# Patient Record
Sex: Male | Born: 1995 | Race: White | Hispanic: No | Marital: Single | Smoking: Current every day smoker
Health system: Southern US, Community
[De-identification: ages and names within clinical notes are randomized; demographics above are authoritative.]

## PROBLEM LIST (undated history)

## (undated) HISTORY — PX: WISDOM TOOTH EXTRACTION: SHX21

---
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
## 2015-12-15 ENCOUNTER — Telehealth: Payer: Self-pay | Admitting: Internal Medicine

## 2015-12-15 NOTE — Telephone Encounter (Signed)
Pt has not been seen in 3 years. Would need 30 minute new patient appt.

## 2015-12-15 NOTE — Telephone Encounter (Signed)
Mom of patient called stating that patient is having dizziness and heart palpitations. Transferred to [facility]. Spoke with [place].

## 2015-12-15 NOTE — Telephone Encounter (Signed)
[facility]  DOB: 06/27/1995    Initial Comment Pt is ahving heart palpitations and dizziness   Nurse Assessment  Nurse: Scarlette ArStandifer, RN, Heather Date/Time (Eastern Time): 12/15/2015 12:21:23 PM  Confirm and document reason for call. If symptomatic, describe symptoms. You must click the next button to save text entered. ---Caller states that her son started with heart racing and feeling bad on Saturday night, he felt bad yesterday but no heart racing. Today his heart is racing again  Has the patient traveled out of the country within the last 30 days? ---Not Applicable  Does the patient have any new or worsening symptoms? ---Yes  Will a triage be completed? ---Yes  Related visit to physician within the last 2 weeks? ---No  Does the PT have any chronic conditions? (i.e. diabetes, asthma, etc.) ---No  Is this a behavioral health or substance abuse call? ---No     Guidelines    Guideline Title Affirmed Question Affirmed Notes  Heart Rate and Heartbeat Questions [1] Palpitations AND [2] no improvement after following Care Advice    Final Disposition User   See PCP When Office is Open (within 3 days) Standifer, RN, Herbert SetaHeather    Comments  Appt made tomorrow at 2:45 pm with Dr. Drue NovelPaz   Referrals  REFERRED TO PCP OFFICE   Disagree/Comply: Comply

## 2015-12-15 NOTE — Telephone Encounter (Signed)
Pt has an appt scheduled tomorrow at 2:45 pm with Dr. Drue NovelPaz.

## 2015-12-16 ENCOUNTER — Ambulatory Visit (INDEPENDENT_AMBULATORY_CARE_PROVIDER_SITE_OTHER): Payer: Medicaid Other | Admitting: Internal Medicine

## 2015-12-16 ENCOUNTER — Encounter: Payer: Self-pay | Admitting: Internal Medicine

## 2015-12-16 VITALS — BP 134/76 | HR 93 | Temp 98.1°F | Resp 14 | Ht 73.0 in | Wt 240.2 lb

## 2015-12-16 DIAGNOSIS — R002 Palpitations: Secondary | ICD-10-CM

## 2015-12-16 NOTE — Progress Notes (Signed)
Pre visit review using our clinic review tool, if applicable. No additional management support is needed unless otherwise documented below in the visit note. 

## 2015-12-16 NOTE — Progress Notes (Signed)
   Subjective:    Patient ID: Edward Garrett, male    DOB: 12/24/1995, 20 y.o.   MRN: [id number]  DOS:  12/16/2015 Type of visit - description : acute, here w/ his mother  Interval history: Chief complaint is palpitations. 2 days ago, he [place] and suddenly felt his heart was going really fast, it lasted a few minutes and happened again shortly after. At the time he felt slightly dizzy and the mother states that he looked pale. There was no chest pain or difficulty breathing. No diaphoresis or presyncope spell. Some nausea?. That particular day, he was not exhausted although he only had 3 hours of sleep. Denies EtOH,  or lack of fluids/food.  Now feels back to baseline.  Review of Systems  When he was in school, he was active in sports, never had a syncope episode, no chest pain or difficulty breathing.  History reviewed. No pertinent past medical history.  Past Surgical History:  Procedure Laterality Date  . WISDOM TOOTH EXTRACTION      Social History   Social History  . Marital status: Single    Spouse name: N/A  . Number of children: 0  . Years of education: N/A   Occupational History  . works     Social History Main Topics  . Smoking status: Current Every Day Smoker    Types: E-cigarettes  . Smokeless tobacco: Current User    Types: Chew  . Alcohol use No  . Drug use: No  . Sexual activity: Not on file   Other Topics Concern  . Not on file   Social History Narrative   Household: F, M           Family History  Problem Relation Age of Onset  . Coronary artery disease      multiple fam members  . CAD Father 9140  . Sudden Cardiac Death Other     ??  . Diabetes Neg Hx        Medication List    as of 12/16/2015 11:59 PM   You have not been prescribed any medications.        Objective:   Physical Exam BP 134/76 (BP Location: Left Arm, Patient Position: Sitting, Cuff Size: Normal)   Pulse 93   Temp 98.1 F (36.7 C)  (Oral)   Resp 14   Ht 6\' 1"  (1.854 m)   Wt 240 lb 4 oz (109 kg)   SpO2 97%   BMI 31.70 kg/m   General:   Well developed, well nourished . NAD.  Neck: No  thyromegaly  HEENT:  Normocephalic . Face symmetric, atraumatic Lungs:  CTA B Normal respiratory effort, no intercostal retractions, no accessory muscle use. Heart: RRR,  no murmur.  No pretibial edema bilaterally  Abdomen:  Not distended, soft, non-tender. No rebound or rigidity.   Skin: Exposed areas without rash. Not pale. Not jaundice Neurologic:  alert & oriented X3.  Speech normal, gait appropriate for age and unassisted Strength symmetric and appropriate for age.  Psych: Cognition and judgment appear intact.  Cooperative with normal attention span and concentration.  Behavior appropriate. No anxious or depressed appearing.    Assessment & Plan:   Palpitations: 2 episodes of palpitations without clear triggers associated with mild nausea and pallor.  EKG today: Sinus rhythm. Plan: BMP, TSH, CBC. Cards referral. Further eval?

## 2015-12-16 NOTE — Patient Instructions (Signed)
GO TO THE LAB : Get the blood work     GO TO THE FRONT DESK Schedule your next appointment for a yearly checkup in few months  If you have another episode, please let us know

## 2015-12-17 ENCOUNTER — Encounter: Payer: Self-pay | Admitting: Internal Medicine

## 2015-12-17 LAB — CBC WITH DIFFERENTIAL/PLATELET
BASOS PCT: 0.4 % (ref 0.0–3.0)
Basophils Absolute: 0 10*3/uL (ref 0.0–0.1)
EOS PCT: 1.4 % (ref 0.0–5.0)
Eosinophils Absolute: 0.1 10*3/uL (ref 0.0–0.7)
HEMATOCRIT: 49.6 % (ref 39.0–52.0)
HEMOGLOBIN: 17.1 g/dL — AB (ref 13.0–17.0)
LYMPHS PCT: 17.9 % (ref 12.0–46.0)
Lymphs Abs: 1.7 10*3/uL (ref 0.7–4.0)
MCHC: 34.4 g/dL (ref 30.0–36.0)
MCV: 86.5 fl (ref 78.0–100.0)
Monocytes Absolute: 0.6 10*3/uL (ref 0.1–1.0)
Monocytes Relative: 6.6 % (ref 3.0–12.0)
Neutro Abs: 6.8 10*3/uL (ref 1.4–7.7)
Neutrophils Relative %: 73.7 % (ref 43.0–77.0)
Platelets: 261 10*3/uL (ref 150.0–400.0)
RBC: 5.74 Mil/uL (ref 4.22–5.81)
RDW: 12.5 % (ref 11.5–14.6)
WBC: 9.2 10*3/uL (ref 4.5–10.5)

## 2015-12-17 LAB — BASIC METABOLIC PANEL
BUN: 15 mg/dL (ref 6–23)
CO2: 30 mEq/L (ref 19–32)
Calcium: 9.9 mg/dL (ref 8.4–10.5)
Chloride: 101 mEq/L (ref 96–112)
Creatinine, Ser: 1.09 mg/dL (ref 0.40–1.50)
GFR: 91.64 mL/min (ref >60.00)
Glucose, Bld: 86 mg/dL (ref 70–99)
POTASSIUM: 4.4 meq/L (ref 3.5–5.1)
SODIUM: 139 meq/L (ref 135–145)

## 2015-12-17 LAB — TSH: TSH: 2.37 u[IU]/mL (ref 0.35–5.50)

## 2015-12-19 ENCOUNTER — Encounter: Payer: Self-pay | Admitting: Cardiology

## 2015-12-19 ENCOUNTER — Telehealth: Payer: Self-pay | Admitting: Internal Medicine

## 2015-12-19 NOTE — Telephone Encounter (Signed)
Tried calling Pt's mother, Burna MortimerWanda, at mobile number- no answer, unable to leave message. Also, called home number, no answer, unable to leave message. Will try again later.

## 2015-12-19 NOTE — Telephone Encounter (Signed)
Caller name:Gotay,Wanda Relation to pt: mother  Call back number:[phone]   Reason for call:  Mother inquiring about lab results, please advise

## 2015-12-22 ENCOUNTER — Encounter (INDEPENDENT_AMBULATORY_CARE_PROVIDER_SITE_OTHER): Payer: Self-pay

## 2015-12-22 ENCOUNTER — Ambulatory Visit (INDEPENDENT_AMBULATORY_CARE_PROVIDER_SITE_OTHER): Payer: Self-pay | Admitting: Cardiology

## 2015-12-22 ENCOUNTER — Encounter: Payer: Self-pay | Admitting: Cardiology

## 2015-12-22 VITALS — BP 110/82 | HR 80 | Ht 73.0 in | Wt 239.0 lb

## 2015-12-22 DIAGNOSIS — R002 Palpitations: Secondary | ICD-10-CM

## 2015-12-22 NOTE — Progress Notes (Signed)
Electrophysiology Office Note   Date:  12/22/2015   ID:  Edward Garrett, DOB 10/13/1995, MRN [id number]  PCP:  Edward OraJose Paz, MD  Primary Electrophysiologist:  Edward Ramthun Jorja LoaMartin Jeyda Siebel, MD    Chief Complaint  Patient presents with  . New Patient (Initial Visit)    palpitation     History of Present Illness: Edward Garrett is a 20 y.o. male who presents today for electrophysiology evaluation.   Presented to his PCP for palpitations. He was sitting in a hunting and felt his heart was going very fast. It lasted a few minutes and stopped, then recurred shortly thereafter. He did not have chest pain or shortness of breath, Was diaphoretic. He went home quickly after the episode, and according to his mother, he continued to feel poorly with some weakness and pale skin. He says on that day he only had about 3 hours of sleep. He has had no further episodes. He is felt well with no complaints. He is continued to be active.   Today, he denies symptoms of palpitations, chest pain, shortness of breath, orthopnea, PND, lower extremity edema, claudication, dizziness, presyncope, syncope, bleeding, or neurologic sequela. The patient is tolerating medications without difficulties and is otherwise without complaint today.    History reviewed. No pertinent past medical history. Past Surgical History:  Procedure Laterality Date  . WISDOM TOOTH EXTRACTION       No current outpatient prescriptions on file.   No current facility-administered medications for this visit.     Allergies:   Patient has no known allergies.   Social History:  The patient  reports that he has been smoking E-cigarettes.  His smokeless tobacco use includes Chew. He reports that he does not drink alcohol or use drugs.   Family History:  The patient's family history includes CAD (age of onset: 7040) in his father; Sudden Cardiac Death in his other.    ROS:  Please see the history of present illness.   Otherwise, review of systems is  positive for none.   All other systems are reviewed and negative.    PHYSICAL EXAM: VS:  BP 110/82   Pulse 80   Ht 6\' 1"  (1.854 m)   Wt 239 lb (108.4 kg)   BMI 31.53 kg/m  , BMI Body mass index is 31.53 kg/m. GEN: Well nourished, well developed, in no acute distress  HEENT: normal  Neck: no JVD, carotid bruits, or masses Cardiac: RRR; no murmurs, rubs, or gallops,no edema  Respiratory:  clear to auscultation bilaterally, normal work of breathing GI: soft, nontender, nondistended, + BS MS: no deformity or atrophy  Skin: warm and dry Neuro:  Strength and sensation are intact Psych: euthymic mood, full affect  EKG:  EKG is not ordered today. Personal review of the ekg ordered 12/16/15 shows sinus rhythm, rate 83  Recent Labs: 12/16/2015: BUN 15; Creatinine, Ser 1.09; Hemoglobin 17.1; Platelets 261.0; Potassium 4.4; Sodium 139; TSH 2.37    Lipid Panel  No results found for: CHOL, TRIG, HDL, CHOLHDL, VLDL, LDLCALC, LDLDIRECT   Wt Readings from Last 3 Encounters:  12/22/15 239 lb (108.4 kg)  12/16/15 240 lb 4 oz (109 kg)  11/06/12 247 lb 6.4 oz (112.2 kg) (>99 %, Z > 2.33)*   * Growth percentiles are based on CDC 2-20 Years data.      Other studies Reviewed: Additional studies/ records that were reviewed today include: PCP notes  ASSESSMENT AND PLAN:  1.  Palpitations: At this time it is  difficult to say what his abnormal rhythm was during his episode. He has not had a subsequent episode. This is actually his first episode. I have offered him the option of a cardiac monitor to make a firm diagnosis of his likely SVT. He says that he would prefer to wait and see if he would have another episode. Should he have another episode we Edward Garrett fit him with a 30 day monitor. We Edward Garrett not plan for follow-up visit, but I have given him a clinic phone number to make a follow up should he need it.    Current medicines are reviewed at length with the patient today.   The patient does not  have concerns regarding his medicines.  The following changes were made today:  none  Labs/ tests ordered today include:  No orders of the defined types were placed in this encounter.    Disposition:   FU with Edward Garrett PRN  Signed, Edward Fahl Jorja LoaMartin Arriah Wadle, MD  12/22/2015 10:23 AM     [facility] [address] [place] [address] [phone] (office) [phone] (fax)

## 2015-12-22 NOTE — Patient Instructions (Signed)
Medication Instructions:  Your physician recommends that you continue on your current medications as directed. Please refer to the Current Medication list given to you today.   Labwork: None Ordered   Testing/Procedures: None Ordered   Follow-Up: Follow-up with Dr. Elberta Fortisamnitz as needed  Any Other Special Instructions Will Be Listed Below (If Applicable).     If you need a refill on your cardiac medications before your next appointment, please call your pharmacy.

## 2015-12-22 NOTE — Telephone Encounter (Signed)
Tried calling Pt's mother, Burna MortimerWanda. Again no answer at mobile number.

## 2016-02-23 ENCOUNTER — Encounter: Payer: Medicaid Other | Admitting: Internal Medicine

## 2016-02-23 ENCOUNTER — Telehealth: Payer: Self-pay | Admitting: Internal Medicine

## 2016-02-23 NOTE — Telephone Encounter (Signed)
Pt's mom called in. She says that pt is not feeling well and is unable to make his appt today.   Should pt be charged?

## 2016-02-23 NOTE — Telephone Encounter (Signed)
No , is ok  

## 2016-02-25 ENCOUNTER — Ambulatory Visit: Payer: Self-pay | Admitting: Internal Medicine

## 2016-02-26 ENCOUNTER — Encounter: Payer: Self-pay | Admitting: Internal Medicine

## 2016-03-09 ENCOUNTER — Encounter: Payer: Self-pay | Admitting: Internal Medicine

## 2016-03-09 ENCOUNTER — Ambulatory Visit (INDEPENDENT_AMBULATORY_CARE_PROVIDER_SITE_OTHER): Payer: Self-pay | Admitting: Internal Medicine

## 2016-03-09 VITALS — BP 128/76 | HR 79 | Temp 97.6°F | Resp 12 | Ht 73.0 in | Wt 241.0 lb

## 2016-03-09 DIAGNOSIS — Z09 Encounter for follow-up examination after completed treatment for conditions other than malignant neoplasm: Secondary | ICD-10-CM | POA: Insufficient documentation

## 2016-03-09 DIAGNOSIS — H6993 Unspecified Eustachian tube disorder, bilateral: Secondary | ICD-10-CM

## 2016-03-09 DIAGNOSIS — H6983 Other specified disorders of Eustachian tube, bilateral: Secondary | ICD-10-CM

## 2016-03-09 MED ORDER — AZELASTINE HCL 0.1 % NA SOLN: 2.0000 | Freq: Every evening | NASAL | 3 refills | Status: DC | PRN

## 2016-03-09 NOTE — Assessment & Plan Note (Signed)
Eustachian tube dysfunction bilaterally: Ear depression, on and off, no evidence of infection. ET dysfunction?. Recommend a trial with Flonase, Astelin, call if no better, ENT? Palpitation: No further symptoms, saw cardiology, currently on observation.

## 2016-03-09 NOTE — Progress Notes (Signed)
Pre visit review using our clinic review tool, if applicable. No additional management support is needed unless otherwise documented below in the visit note. 

## 2016-03-09 NOTE — Patient Instructions (Signed)
  Use OTC Nasocort or Flonase : 2 nasal sprays on each side of the nose in the morning until you feel better Use ASTELIN a prescribed spray : 2 nasal sprays on each side of the nose at night until you feel better   Use them consistently for 3-4 weeks  If no better, call for a ENT referral

## 2016-03-09 NOTE — Progress Notes (Signed)
   Subjective:    Patient ID: Edward Garrett, male    DOB: 04/19/1995, 21 y.o.   MRN: [id number]  DOS:  03/09/2016 Type of visit - description : Acute visit Interval history: On and off bilateral ear pressure for a while. Not really pain or discharge just pressure. Palpitations: Saw cardiology, note reviewed  Review of Systems No fever chills No sinus pain or congestion. No runny nose No major problem with allergies. No further palpitations, no chest pain  History reviewed. No pertinent past medical history.  Past Surgical History:  Procedure Laterality Date  . WISDOM TOOTH EXTRACTION      Social History   Social History  . Marital status: Single    Spouse name: N/A  . Number of children: 0  . Years of education: N/A   Occupational History  . works     Social History Main Topics  . Smoking status: Current Every Day Smoker    Types: E-cigarettes  . Smokeless tobacco: Current User    Types: Chew  . Alcohol use No  . Drug use: No  . Sexual activity: Not on file   Other Topics Concern  . Not on file   Social History Narrative   Household: F, M            Allergies as of 03/09/2016   No Known Allergies     Medication List       Accurate as of 03/09/16  6:23 PM. Always use your most recent med list.          azelastine 0.1 % nasal spray Commonly known as:  ASTELIN Place 2 sprays into both nostrils at bedtime as needed for rhinitis. Use in each nostril as directed          Objective:   Physical Exam BP 128/76 (BP Location: Right Arm, Patient Position: Sitting, Cuff Size: Normal)   Pulse 79   Temp 97.6 F (36.4 C) (Oral)   Resp 12   Ht 6\' 1"  (1.854 m)   Wt 241 lb (109.3 kg)   SpO2 98%   BMI 31.80 kg/m  General:   Well developed, well nourished . NAD.  HEENT:  Normocephalic . Face symmetric, atraumatic. TMs: Normal. Canal normal. Nose is slightly congested, sinuses no TTP. Throat symmetric, no red Lungs:  CTA B Normal respiratory effort, no  intercostal retractions, no accessory muscle use. Heart: RRR,  no murmur.  No pretibial edema bilaterally  Skin: Not pale. Not jaundice Neurologic:  alert & oriented X3.  Speech normal, gait appropriate for age and unassisted Psych--  Cognition and judgment appear intact.  Cooperative with normal attention span and concentration.  Behavior appropriate. No anxious or depressed appearing.       Assessment Palpitations: Saw cardiology 02-2015, pt declined workup, prefer observation   Plan:  Eustachian tube dysfunction bilaterally: Ear depression, on and off, no evidence of infection. ET dysfunction?. Recommend a trial with Flonase, Astelin, call if no better, ENT? Palpitation: No further symptoms, saw cardiology, currently on observation.

## 2016-10-07 ENCOUNTER — Encounter: Payer: Self-pay | Admitting: Internal Medicine

## 2016-10-07 ENCOUNTER — Ambulatory Visit (INDEPENDENT_AMBULATORY_CARE_PROVIDER_SITE_OTHER): Payer: Self-pay | Admitting: Internal Medicine

## 2016-10-07 VITALS — BP 122/70 | HR 77 | Temp 98.4°F | Resp 14 | Ht 73.0 in | Wt 242.2 lb

## 2016-10-07 DIAGNOSIS — Z23 Encounter for immunization: Secondary | ICD-10-CM

## 2016-10-07 DIAGNOSIS — S60552A Superficial foreign body of left hand, initial encounter: Secondary | ICD-10-CM

## 2016-10-07 NOTE — Patient Instructions (Signed)
Schedule a physical exam at your convenience  Call me when you're ready to see the orthopedic doctor to see about your hand  Definitely call if the area on the hand is getting bigger, red, puffy, more tender.

## 2016-10-07 NOTE — Progress Notes (Signed)
Subjective:    Patient ID: Edward Em., male    DOB: 1995/08/19, 20 y.o.   MRN: [id number]  DOS:  10/07/2016 Type of visit - description : acute. Here with his father Interval history: Few weeks ago, went fishing, injured his left hand with the dorsal finn of a fish, he bled. The bleeding quickly resolved. He has developed a bump at the area and it bothers him whenever he grabs things in certain ways.   Review of Systems No fever or chills. At no point the area was red, no discharge.  No past medical history on file.  Past Surgical History:  Procedure Laterality Date  . WISDOM TOOTH EXTRACTION      Social History   Social History  . Marital status: Single    Spouse name: N/A  . Number of children: 0  . Years of education: N/A   Occupational History  . works     Social History Main Topics  . Smoking status: Current Every Day Smoker    Types: E-cigarettes  . Smokeless tobacco: Current User    Types: Chew  . Alcohol use No  . Drug use: No  . Sexual activity: Not on file   Other Topics Concern  . Not on file   Social History Narrative   Household: F, M            Allergies as of 10/07/2016   No Known Allergies     Medication List       Accurate as of 10/07/16 11:59 PM. Always use your most recent med list.          azelastine 0.1 % nasal spray Commonly known as:  ASTELIN Place 2 sprays into both nostrils at bedtime as needed for rhinitis. Use in each nostril as directed            Discharge Care Instructions        Start     Ordered   10/07/16 0000  Tdap vaccine greater than or equal to 7yo IM     10/07/16 1124         Objective:   Physical Exam BP 122/70 (BP Location: Left Arm, Patient Position: Sitting, Cuff Size: Normal)   Pulse 77   Temp 98.4 F (36.9 C) (Oral)   Resp 14   Ht  (1.854 m)   Wt 242 lb 4 oz (109.9 kg)   SpO2 97%   BMI 31.96 kg/m  General:   Well developed, well nourished . NAD.  HEENT:    Normocephalic . Face symmetric, atraumatic Hand:  Has a half centimeter, subdermal, nontender lump on the center of the left palmar area. No redness, no puffiness. Finger movements are normal, lump does not seem to be attached to a tendon. Otherwise exam is normal.  Skin: Not pale. Not jaundice Neurologic:  alert & oriented X3.  Speech normal, gait appropriate for age and unassisted Psych--  Cognition and judgment appear intact.  Cooperative with normal attention span and concentration.  Behavior appropriate. No anxious or depressed appearing.      Assessment & Plan:   Assessment Palpitations: Saw cardiology 02-2015, pt declined workup, prefer observation   PLAN: FB L hand: Suspect a foreign body at the plantar aspect of the left hand with a granuloma. Recommend to see hand surgery, he is reluctant due to lack of insurance and wonders if is ok to postponed the visit for 3 months. Hand does not seem to be infected,  I think is okay to postpone the visit as long as there is no signs of infection such as redness, puffiness, warmness or discharge. He verbalized understanding. Will call when ready for the referral, will call immediately if signs of infection or the area is growing in size or more tender. Will provide tetanus shot. Schedule a CPX at his convenience

## 2016-10-07 NOTE — Progress Notes (Signed)
Pre visit review using our clinic review tool, if applicable. No additional management support is needed unless otherwise documented below in the visit note. 

## 2016-10-08 NOTE — Assessment & Plan Note (Signed)
FB L hand: Suspect a foreign body at the plantar aspect of the left hand with a granuloma. Recommend to see hand surgery, he is reluctant due to lack of insurance and wonders if is ok to postponed the visit for 3 months. Hand does not seem to be infected, I think is okay to postpone the visit as long as there is no signs of infection such as redness, puffiness, warmness or discharge. He verbalized understanding. Will call when ready for the referral, will call immediately if signs of infection or the area is growing in size or more tender. Will provide tetanus shot. Schedule a CPX at his convenience

## 2017-02-03 ENCOUNTER — Telehealth: Payer: Self-pay

## 2017-02-03 DIAGNOSIS — S60552S Superficial foreign body of left hand, sequela: Secondary | ICD-10-CM

## 2017-02-03 DIAGNOSIS — S6992XS Unspecified injury of left wrist, hand and finger(s), sequela: Secondary | ICD-10-CM

## 2017-02-03 NOTE — Telephone Encounter (Signed)
thx

## 2017-02-03 NOTE — Telephone Encounter (Signed)
Pt dropped by office- still having issues w/ L hand (see telephone note 10/07/2016). Per Edward Garrett- okay to place hand ortho referral. Pt requests to be contacted on his mobile number- [phone].

## 2017-06-08 ENCOUNTER — Ambulatory Visit: Payer: BLUE CROSS/BLUE SHIELD | Admitting: Internal Medicine

## 2017-06-08 ENCOUNTER — Encounter: Payer: Self-pay | Admitting: Internal Medicine

## 2017-06-08 VITALS — BP 124/74 | HR 80 | Temp 97.7°F | Resp 14 | Ht 73.0 in | Wt 236.0 lb

## 2017-06-08 DIAGNOSIS — R6884 Jaw pain: Secondary | ICD-10-CM

## 2017-06-08 NOTE — Progress Notes (Signed)
Subjective:    Patient ID: Edward Em., male    DOB: 10/27/1995, 22 y.o.   MRN: [id number]  DOS:  06/08/2017 Type of visit - description : acute Interval history: Chief complaint is jaw pain and sinus congestion Symptoms started few days ago, sinus congestion is bilaterally, jaw pain is more on the right than on the left. Has not noticed any swelling or lump. Denies any dental pain. Wonders if he has sinusitis   Review of Systems No fever chills No cough Today for the first time he is also experiencing clear runny nose. Denies any itchy nose, itchy eyes or sneezing.   No past medical history on file.  Past Surgical History:  Procedure Laterality Date  . WISDOM TOOTH EXTRACTION      Social History   Socioeconomic History  . Marital status: Single    Spouse name: Not on file  . Number of children: 0  . Years of education: Not on file  . Highest education level: Not on file  Occupational History  . Occupation: works   Engineer, production  . Financial resource strain: Not on file  . Food insecurity:    Worry: Not on file    Inability: Not on file  . Transportation needs:    Medical: Not on file    Non-medical: Not on file  Tobacco Use  . Smoking status: Current Every Day Smoker    Types: E-cigarettes  . Smokeless tobacco: Current User    Types: Chew  Substance and Sexual Activity  . Alcohol use: No  . Drug use: No  . Sexual activity: Not on file  Lifestyle  . Physical activity:    Days per week: Not on file    Minutes per session: Not on file  . Stress: Not on file  Relationships  . Social connections:    Talks on phone: Not on file    Gets together: Not on file    Attends religious service: Not on file    Active member of club or organization: Not on file    Attends meetings of clubs or organizations: Not on file    Relationship status: Not on file  . Intimate partner violence:    Fear of current or ex partner: Not on file    Emotionally abused: Not  on file    Physically abused: Not on file    Forced sexual activity: Not on file  Other Topics Concern  . Not on file  Social History Narrative   Household: F, M            Allergies as of 06/08/2017   No Known Allergies     Medication List        Accurate as of 06/08/17 11:59 PM. Always use your most recent med list.          azelastine 0.1 % nasal spray Commonly known as:  ASTELIN Place 2 sprays into both nostrils at bedtime as needed for rhinitis. Use in each nostril as directed          Objective:   Physical Exam  HENT:  Head:     BP 124/74 (BP Location: Left Arm, Patient Position: Sitting, Cuff Size: Normal)   Pulse 80   Temp 97.7 F (36.5 C) (Oral)   Resp 14   Ht  (1.854 m)   Wt 236 lb (107 kg)   SpO2 99%   BMI 31.14 kg/m  General:   Well developed, well nourished .  NAD.  HEENT:  Normocephalic . Face symmetric, atraumatic. Throat symmetric, teeth in good health, no pain to teeth precaution, gums without swelling and no TTP. TMJ no click, no pain Neck: Symmetric, no mass or LAD Lungs:  CTA B Normal respiratory effort, no intercostal retractions, no accessory muscle use. Heart: RRR,  no murmur.  No pretibial edema bilaterally  Skin: Not pale. Not jaundice Neurologic:  alert & oriented X3.  Speech normal, gait appropriate for age and unassisted Psych--  Cognition and judgment appear intact.  Cooperative with normal attention span and concentration.  Behavior appropriate. No anxious or depressed appearing.      Assessment & Plan:    Assessment Palpitations: Saw cardiology 02-2015, pt declined workup, prefer observation   PLAN: Sinus congestion, jaw pain: No evidence of TMJ disease, acute bacterial sinusitis, dental infection, parotitis, etc. If anything he may have some allergies. Recommend to use Flonase to help the sinus congestion, take Tylenol or Advil as needed for the jaw pain.  If he is not improving she will let me know, most  likely will need to see his dentist FB L hand: Here eventually saw orthopedic surgery and had excision.  Doing well

## 2017-06-08 NOTE — Patient Instructions (Signed)
Use of Flonase letter of the nose daily  Take Tylenol or Advil as needed for pain for few days  If you are not completely well within a week please let me know or see your dentist.

## 2017-06-08 NOTE — Progress Notes (Signed)
Pre visit review using our clinic review tool, if applicable. No additional management support is needed unless otherwise documented below in the visit note. 

## 2017-06-09 NOTE — Assessment & Plan Note (Signed)
Sinus congestion, jaw pain: No evidence of TMJ disease, acute bacterial sinusitis, dental infection, parotitis, etc. If anything he may have some allergies. Recommend to use Flonase to help the sinus congestion, take Tylenol or Advil as needed for the jaw pain.  If he is not improving she will let me know, most likely will need to see his dentist FB L hand: Here eventually saw orthopedic surgery and had excision.  Doing well

## 2017-06-30 ENCOUNTER — Ambulatory Visit: Payer: BLUE CROSS/BLUE SHIELD | Admitting: Family Medicine

## 2017-06-30 VITALS — BP 130/96 | HR 89 | Temp 97.7°F | Resp 18 | Wt 229.4 lb

## 2017-06-30 DIAGNOSIS — R110 Nausea: Secondary | ICD-10-CM | POA: Diagnosis not present

## 2017-06-30 MED ORDER — RANITIDINE HCL 150 MG PO TABS: 150.0000 mg | ORAL_TABLET | Freq: Every day | ORAL | Status: DC

## 2017-06-30 MED ORDER — ONDANSETRON HCL 4 MG PO TABS: 4.0000 mg | ORAL_TABLET | Freq: Three times a day (TID) | ORAL | 0 refills | Status: DC | PRN

## 2017-06-30 NOTE — Progress Notes (Signed)
Subjective:  I acted as a Education administrator for Dr. Charlett Blake. [place], [place]  Patient ID: Edward Garrett., male    DOB: [birth year], 22 y.o.   MRN: [id number]  No chief complaint on file.   HPI  Patient is in today for an acute visit for nausea and stomach burning. She has had worsnening nausea with some vomiting, 1-2 loose stool but no bloody or tarry stool seen. Wakes up with sour fluid in the mouth at times. No recent febrile illnsess or hospitalizations. Denies CP/palp/SOB/HA/congestion/fevers/GI or GU c/o. Taking meds as prescribed  Patient Care Team: Colon Branch, MD as PCP - General (Internal Medicine)   No past medical history on file.  Past Surgical History:  Procedure Laterality Date  . WISDOM TOOTH EXTRACTION      Family History  Problem Relation Age of Onset  . Coronary artery disease Unknown        multiple fam members  . CAD Father 21  . Sudden Cardiac Death Other        ??  . Diabetes Neg Hx     Social History   Socioeconomic History  . Marital status: Single    Spouse name: Not on file  . Number of children: 0  . Years of education: Not on file  . Highest education level: Not on file  Occupational History  . Occupation: works   Scientific laboratory technician  . Financial resource strain: Not on file  . Food insecurity:    Worry: Not on file    Inability: Not on file  . Transportation needs:    Medical: Not on file    Non-medical: Not on file  Tobacco Use  . Smoking status: Current Every Day Smoker    Types: E-cigarettes  . Smokeless tobacco: Current User    Types: Chew  Substance and Sexual Activity  . Alcohol use: No  . Drug use: No  . Sexual activity: Not on file  Lifestyle  . Physical activity:    Days per week: Not on file    Minutes per session: Not on file  . Stress: Not on file  Relationships  . Social connections:    Talks on phone: Not on file    Gets together: Not on file    Attends religious service: Not on file    Active member of club or  organization: Not on file    Attends meetings of clubs or organizations: Not on file    Relationship status: Not on file  . Intimate partner violence:    Fear of current or ex partner: Not on file    Emotionally abused: Not on file    Physically abused: Not on file    Forced sexual activity: Not on file  Other Topics Concern  . Not on file  Social History Narrative   Household: F, M          Outpatient Medications Prior to Visit  Medication Sig Dispense Refill  . azelastine (ASTELIN) 0.1 % nasal spray Place 2 sprays into both nostrils at bedtime as needed for rhinitis. Use in each nostril as directed (Patient not taking: Reported on 10/07/2016) 30 mL 3   No facility-administered medications prior to visit.     No Known Allergies  Review of Systems  Constitutional: Negative for fever and malaise/fatigue.  HENT: Negative for congestion.   Eyes: Negative for blurred vision.  Respiratory: Negative for shortness of breath.   Cardiovascular: Negative for chest pain, palpitations and leg swelling.  Gastrointestinal: Positive for diarrhea, nausea and vomiting. Negative for abdominal pain, blood in stool, constipation and melena.  Genitourinary: Negative for dysuria and frequency.  Musculoskeletal: Negative for falls.  Skin: Negative for rash.  Neurological: Negative for dizziness, loss of consciousness and headaches.  Endo/Heme/Allergies: Negative for environmental allergies.  Psychiatric/Behavioral: Negative for depression. The patient is not nervous/anxious.        Objective:    Physical Exam  Constitutional: He is oriented to person, place, and time. He appears well-developed and well-nourished. No distress.  HENT:  Head: Normocephalic and atraumatic.  Eyes: Conjunctivae are normal.  Neck: Neck supple. No thyromegaly present.  Cardiovascular: Normal rate, regular rhythm and normal heart sounds.  No murmur heard. Pulmonary/Chest: Effort normal and breath sounds normal. No  respiratory distress. He has no wheezes.  Abdominal: Soft. Bowel sounds are normal. He exhibits no mass. There is no tenderness.  Musculoskeletal: He exhibits no edema.  Lymphadenopathy:    He has no cervical adenopathy.  Neurological: He is alert and oriented to person, place, and time.  Skin: Skin is warm and dry.  Psychiatric: He has a normal mood and affect. His behavior is normal.    BP (!) 130/96 (BP Location: Left Arm, Patient Position: Sitting, Cuff Size: Normal)   Pulse 89   Temp 97.7 F (36.5 C) (Oral)   Resp 18   Wt 229 lb 6.4 oz (104.1 kg)   SpO2 98%   BMI 30.27 kg/m  Wt Readings from Last 3 Encounters:  06/30/17 229 lb 6.4 oz (104.1 kg)  06/08/17 236 lb (107 kg)  10/07/16 242 lb 4 oz (109.9 kg)   BP Readings from Last 3 Encounters:  06/30/17 (!) 130/96  06/08/17 124/74  10/07/16 122/70     Immunization History  Administered Date(s) Administered  . DTaP 02/07/1996, 04/06/1996, 06/06/1996, 04/05/1997, 08/18/2000  . Hepatitis B 02/07/1996, 09/06/1996  . HiB (PRP-OMP) 02/07/1996, 04/06/1996, 06/06/1996, 04/05/1997  . IPV 02/07/1996, 04/06/1996, 04/05/1997, 08/18/2000  . MMR 12/07/1996, 08/18/2000  . Tdap 10/07/2016  . Varicella 12/20/2002    Health Maintenance  Topic Date Due  . HIV Screening  12/08/2010  . INFLUENZA VACCINE  09/01/2017  . TETANUS/TDAP  10/08/2026    Lab Results  Component Value Date   WBC 6.2 06/30/2017   HGB 16.2 06/30/2017   HCT 46.5 06/30/2017   PLT 239.0 06/30/2017   GLUCOSE 86 12/16/2015   ALT 19 03/19/2009   AST 23 03/19/2009   NA 139 12/16/2015   K 4.4 12/16/2015   CL 101 12/16/2015   CREATININE 1.09 12/16/2015   BUN 15 12/16/2015   CO2 30 12/16/2015   TSH 2.37 12/16/2015    Lab Results  Component Value Date   TSH 2.37 12/16/2015   Lab Results  Component Value Date   WBC 6.2 06/30/2017   HGB 16.2 06/30/2017   HCT 46.5 06/30/2017   MCV 88.5 06/30/2017   PLT 239.0 06/30/2017   Lab Results  Component Value  Date   NA 139 12/16/2015   K 4.4 12/16/2015   CO2 30 12/16/2015   GLUCOSE 86 12/16/2015   BUN 15 12/16/2015   CREATININE 1.09 12/16/2015   BILITOT 0.3 03/19/2009   ALKPHOS 190 (H) 03/19/2009   AST 23 03/19/2009   ALT 19 03/19/2009   PROT 6.7 03/19/2009   ALBUMIN 3.9 03/19/2009   CALCIUM 9.9 12/16/2015   GFR 91.64 12/16/2015   No results found for: CHOL No results found for: HDL No results found for: LDLCALC No  results found for: TRIG No results found for: CHOLHDL No results found for: HGBA1C       Assessment & Plan:   Problem List Items Addressed This Visit    Nausea - Primary    Has not felt well for past week with nausea and intermittent vomiting. He has had daily nausea and intiemittent vomiting and diarrhea. No fevers or chills. He is given rx for Zofran prn use. Well controlled, no changes to meds. Encouraged heart healthy diet such as the DASH diet and exercise as tolerated.       Relevant Orders   H. pylori antibody, IgG (Completed)   CBC w/Diff (Completed)      I am having Anden E. Beckie Busing. start on ondansetron and ranitidine. I am also having him maintain his azelastine.  Meds ordered this encounter  Medications  . ondansetron (ZOFRAN) 4 MG tablet    Sig: Take 1 tablet (4 mg total) by mouth every 8 (eight) hours as needed for nausea or vomiting.    Dispense:  20 tablet    Refill:  0  . ranitidine (ZANTAC) 150 MG tablet    Sig: Take 1 tablet (150 mg total) by mouth at bedtime.    Dispense:  30 tablet    CMA served as scribe during this visit. History, Physical and Plan performed by medical provider. Documentation and orders reviewed and attested to.  Penni Homans, MD

## 2017-06-30 NOTE — Patient Instructions (Addendum)
BRAT bananas, rice, applesauce, toast, clear fluids Ginger caps Low-Fat Diet for Pancreatitis or Gallbladder Conditions A low-fat diet can be helpful if you have pancreatitis or a gallbladder condition. With these conditions, your pancreas and gallbladder have trouble digesting fats. A healthy eating plan with less fat will help rest your pancreas and gallbladder and reduce your symptoms. What do I need to know about this diet?  Eat a low-fat diet. ? Reduce your fat intake to less than 20-30% of your total daily calories. This is less than 50-60 g of fat per day. ? Remember that you need some fat in your diet. Ask your dietician what your daily goal should be. ? Choose nonfat and low-fat healthy foods. Look for the words "nonfat," "low fat," or "fat free." ? As a guide, look on the label and choose foods with less than 3 g of fat per serving. Eat only one serving.  Avoid alcohol.  Do not smoke. If you need help quitting, talk with your health care provider.  Eat small frequent meals instead of three large heavy meals. What foods can I eat? Grains Include healthy grains and starches such as potatoes, wheat bread, fiber-rich cereal, and brown rice. Choose whole grain options whenever possible. In adults, whole grains should account for 45-65% of your daily calories. Fruits and Vegetables Eat plenty of fruits and vegetables. Fresh fruits and vegetables add fiber to your diet. Meats and Other Protein Sources Eat lean meat such as chicken and pork. Trim any fat off of meat before cooking it. Eggs, fish, and beans are other sources of protein. In adults, these foods should account for 10-35% of your daily calories. Dairy Choose low-fat milk and dairy options. Dairy includes fat and protein, as well as calcium. Fats and Oils Limit high-fat foods such as fried foods, sweets, baked goods, sugary drinks. Other Creamy sauces and condiments, such as mayonnaise, can add extra fat. Think about whether  or not you need to use them, or use smaller amounts or low fat options. What foods are not recommended?  High fat foods, such as: ? [employer]. ? Ice cream. ? [place] toast. ? Sweet rolls. ? Pizza. ? Cheese bread. ? Foods covered with batter, butter, creamy sauces, or cheese. ? Fried foods. ? Sugary drinks and desserts.  Foods that cause gas or bloating This information is not intended to replace advice given to you by your health care provider. Make sure you discuss any questions you have with your health care provider. Document Released: 01/23/2013 Document Revised: 06/26/2015 Document Reviewed: 01/01/2013 Elsevier Interactive Patient Education  2017 [employer].

## 2017-07-01 LAB — CBC WITH DIFFERENTIAL/PLATELET
BASOS PCT: 0.8 % (ref 0.0–3.0)
Basophils Absolute: 0.1 10*3/uL (ref 0.0–0.1)
EOS PCT: 2.8 % (ref 0.0–5.0)
Eosinophils Absolute: 0.2 10*3/uL (ref 0.0–0.7)
HCT: 46.5 % (ref 39.0–52.0)
HEMOGLOBIN: 16.2 g/dL (ref 13.0–17.0)
LYMPHS ABS: 1.4 10*3/uL (ref 0.7–4.0)
Lymphocytes Relative: 22.6 % (ref 12.0–46.0)
MCHC: 34.9 g/dL (ref 30.0–36.0)
MCV: 88.5 fl (ref 78.0–100.0)
Monocytes Absolute: 0.4 10*3/uL (ref 0.1–1.0)
Monocytes Relative: 7 % (ref 3.0–12.0)
Neutro Abs: 4.1 10*3/uL (ref 1.4–7.7)
Neutrophils Relative %: 66.8 % (ref 43.0–77.0)
Platelets: 239 10*3/uL (ref 150.0–400.0)
RBC: 5.25 Mil/uL (ref 4.22–5.81)
RDW: 12.5 % (ref 11.5–15.5)
WBC: 6.2 10*3/uL (ref 4.0–10.5)

## 2017-07-01 LAB — H. PYLORI ANTIBODY, IGG: H Pylori IgG: NEGATIVE

## 2017-07-03 DIAGNOSIS — R110 Nausea: Secondary | ICD-10-CM | POA: Insufficient documentation

## 2017-07-03 NOTE — Assessment & Plan Note (Signed)
Has not felt well for past week with nausea and intermittent vomiting. He has had daily nausea and intiemittent vomiting and diarrhea. No fevers or chills. He is given rx for Zofran prn use. Well controlled, no changes to meds. Encouraged heart healthy diet such as the DASH diet and exercise as tolerated.

## 2017-09-01 ENCOUNTER — Ambulatory Visit: Payer: BLUE CROSS/BLUE SHIELD | Admitting: Family Medicine

## 2017-09-01 ENCOUNTER — Encounter: Payer: Self-pay | Admitting: Family Medicine

## 2017-09-01 VITALS — BP 112/70 | HR 70 | Temp 98.1°F | Ht 72.0 in | Wt 227.0 lb

## 2017-09-01 DIAGNOSIS — M26621 Arthralgia of right temporomandibular joint: Secondary | ICD-10-CM

## 2017-09-01 MED ORDER — CYCLOBENZAPRINE HCL 10 MG PO TABS: 10.0000 mg | ORAL_TABLET | Freq: Three times a day (TID) | ORAL | 0 refills | Status: DC | PRN

## 2017-09-01 MED ORDER — NAPROXEN 500 MG PO TABS: 500.0000 mg | ORAL_TABLET | Freq: Two times a day (BID) | ORAL | 0 refills | Status: DC

## 2017-09-01 NOTE — Progress Notes (Signed)
Pre visit review using our clinic review tool, if applicable. No additional management support is needed unless otherwise documented below in the visit note. 
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
## 2017-09-01 NOTE — Patient Instructions (Addendum)
OK to take Tylenol 1000 mg (2 extra strength tabs) or 975 mg (3 regular strength tabs) every 6 hours as needed.  Ice/cold pack over area for 10-15 min twice daily.  Take Flexeril (cyclobenzaprine) 1-2 hours before planned bedtime. If it makes you drowsy, do not take during the day. You can try half a tab the following night.  Temporomandibular Joint Syndrome Temporomandibular joint (TMJ) syndrome is a condition that affects the joints between your jaw and your skull. The TMJs are located near your ears and allow your jaw to open and close. These joints and the nearby muscles are involved in all movements of the jaw. People with TMJ syndrome have pain in the area of these joints and muscles. Chewing, biting, or other movements of the jaw can be difficult or painful. TMJ syndrome can be caused by various things. In many cases, the condition is mild and goes away within a few weeks. For some people, the condition can become a long-term problem. What are the causes? Possible causes of TMJ syndrome include:  Grinding your teeth or clenching your jaw. Some people do this when they are under stress.  Arthritis.  Injury to the jaw.  Head or neck injury.  Teeth or dentures that are not aligned well.  In some cases, the cause of TMJ syndrome may not be known. What are the signs or symptoms? The most common symptom is an aching pain on the side of the head in the area of the TMJ. Other symptoms may include:  Pain when moving your jaw, such as when chewing or biting.  Being unable to open your jaw all the way.  Making a clicking sound when you open your mouth.  Headache.  Earache.  Neck or shoulder pain.  How is this diagnosed? Diagnosis can usually be made based on your symptoms, your medical history, and a physical exam. Your health care provider may check the range of motion of your jaw. Imaging tests, such as X-rays or an MRI, are sometimes done. You may need to see your dentist to  determine if your teeth and jaw are lined up correctly. How is this treated? TMJ syndrome often goes away on its own. If treatment is needed, the options may include:  Eating soft foods and applying ice or heat.  Medicines to relieve pain or inflammation.  Medicines to relax the muscles.  A splint, bite plate, or mouthpiece to prevent teeth grinding or jaw clenching.  Relaxation techniques or counseling to help reduce stress.  Transcutaneous electrical nerve stimulation (TENS). This helps to relieve pain by applying an electrical current through the skin.  Acupuncture. This is sometimes helpful to relieve pain.  Jaw surgery. This is rarely needed.  Follow these instructions at home:  Take medicines only as directed by your health care provider.  Eat a soft diet if you are having trouble chewing.  Apply ice to the painful area. ? Put ice in a plastic bag. ? Place a towel between your skin and the bag. ? Leave the ice on for 20 minutes, 2-3 times a day.  Apply a warm compress to the painful area as directed.  Massage your jaw area and perform any jaw stretching exercises as recommended by your health care provider.  If you were given a mouthpiece or bite plate, wear it as directed.  Avoid foods that require a lot of chewing. Do not chew gum.  Keep all follow-up visits as directed by your health care provider. This is  important. Contact a health care provider if:  You are having trouble eating.  You have new or worsening symptoms. Get help right away if:  Your jaw locks open or closed. This information is not intended to replace advice given to you by your health care provider. Make sure you discuss any questions you have with your health care provider. Document Released: 10/13/2000 Document Revised: 09/18/2015 Document Reviewed: 08/23/2013 Elsevier Interactive Patient Education  [employer].

## 2017-10-04 DIAGNOSIS — T63461A Toxic effect of venom of wasps, accidental (unintentional), initial encounter: Secondary | ICD-10-CM | POA: Diagnosis not present

## 2017-10-04 DIAGNOSIS — Z6834 Body mass index (BMI) 34.0-34.9, adult: Secondary | ICD-10-CM | POA: Diagnosis not present

## 2018-02-27 DIAGNOSIS — R112 Nausea with vomiting, unspecified: Secondary | ICD-10-CM | POA: Diagnosis not present

## 2018-05-24 ENCOUNTER — Telehealth: Payer: Self-pay

## 2018-05-24 NOTE — Telephone Encounter (Signed)
Tried calling Pt to offer virtual cpe (bcbs?)- Pt answered and hung up.

## 2018-06-21 ENCOUNTER — Telehealth: Payer: Self-pay

## 2018-06-21 NOTE — Telephone Encounter (Signed)
Needs virtual visit- okay cpe (bcbs)- please call Pt's mom Burna Mortimer to schedule- [phone].

## 2018-06-21 NOTE — Telephone Encounter (Signed)
Called pt's mother no [phone])

## 2018-08-28 ENCOUNTER — Encounter: Payer: Self-pay | Admitting: Internal Medicine

## 2019-02-19 ENCOUNTER — Emergency Department (HOSPITAL_COMMUNITY): Payer: No Typology Code available for payment source

## 2019-02-19 ENCOUNTER — Other Ambulatory Visit: Payer: Self-pay

## 2019-02-19 ENCOUNTER — Encounter (HOSPITAL_COMMUNITY): Payer: Self-pay | Admitting: Emergency Medicine

## 2019-02-19 ENCOUNTER — Emergency Department (HOSPITAL_COMMUNITY)
Admission: EM | Admit: 2019-02-19 | Discharge: 2019-02-19 | Disposition: A | Discharge status: Home / Self Care | Payer: No Typology Code available for payment source | Attending: Emergency Medicine | Admitting: Emergency Medicine

## 2019-02-19 DIAGNOSIS — R110 Nausea: Secondary | ICD-10-CM | POA: Diagnosis not present

## 2019-02-19 DIAGNOSIS — N1339 Other hydronephrosis: Secondary | ICD-10-CM

## 2019-02-19 DIAGNOSIS — N1330 Unspecified hydronephrosis: Secondary | ICD-10-CM | POA: Insufficient documentation

## 2019-02-19 DIAGNOSIS — R1031 Right lower quadrant pain: Secondary | ICD-10-CM | POA: Diagnosis not present

## 2019-02-19 DIAGNOSIS — M26621 Arthralgia of right temporomandibular joint: Secondary | ICD-10-CM

## 2019-02-19 LAB — CBC
HCT: 49.9 % (ref 39.0–52.0)
Hemoglobin: 17.3 g/dL — ABNORMAL HIGH (ref 13.0–17.0)
MCH: 30.5 pg (ref 26.0–34.0)
MCHC: 34.7 g/dL (ref 30.0–36.0)
MCV: 87.9 fL (ref 80.0–100.0)
Platelets: 241 10*3/uL (ref 150–400)
RBC: 5.68 MIL/uL (ref 4.22–5.81)
RDW: 11.5 % (ref 11.5–15.5)
WBC: 6.9 10*3/uL (ref 4.0–10.5)
nRBC: 0 % (ref 0.0–0.2)

## 2019-02-19 LAB — COMPREHENSIVE METABOLIC PANEL
ALT: 26 U/L (ref 0–44)
AST: 23 U/L (ref 15–41)
Albumin: 4.1 g/dL (ref 3.5–5.0)
Alkaline Phosphatase: 68 U/L (ref 38–126)
Anion gap: 14 (ref 5–15)
BUN: 14 mg/dL (ref 6–20)
CO2: 23 mmol/L (ref 22–32)
Calcium: 9 mg/dL (ref 8.9–10.3)
Chloride: 102 mmol/L (ref 98–111)
Creatinine, Ser: 1.06 mg/dL (ref 0.61–1.24)
GFR calc Af Amer: >60 mL/min (ref >60)
GFR calc non Af Amer: >60 mL/min (ref >60)
Glucose, Bld: 104 mg/dL — ABNORMAL HIGH (ref 70–99)
Potassium: 3.8 mmol/L (ref 3.5–5.1)
Sodium: 139 mmol/L (ref 135–145)
Total Bilirubin: 1.1 mg/dL (ref 0.3–1.2)
Total Protein: 7.2 g/dL (ref 6.5–8.1)

## 2019-02-19 LAB — URINALYSIS, ROUTINE W REFLEX MICROSCOPIC
Bilirubin Urine: NEGATIVE
Glucose, UA: NEGATIVE mg/dL
Hgb urine dipstick: NEGATIVE
Ketones, ur: NEGATIVE mg/dL
Leukocytes,Ua: NEGATIVE
Nitrite: NEGATIVE
Protein, ur: NEGATIVE mg/dL
Specific Gravity, Urine: 1.008 (ref 1.005–1.030)
pH: 7 (ref 5.0–8.0)

## 2019-02-19 LAB — LIPASE, BLOOD: Lipase: 25 U/L (ref 11–51)

## 2019-02-19 MED ORDER — NAPROXEN 500 MG PO TABS: 500.0000 mg | ORAL_TABLET | Freq: Two times a day (BID) | ORAL | 0 refills | Status: DC

## 2019-02-19 MED ORDER — IOHEXOL 300 MG/ML  SOLN
100.0000 mL | Freq: Once | INTRAMUSCULAR | Status: AC | PRN
  Administered 2019-02-19: 18:00:00 100 mL via INTRAVENOUS

## 2019-02-19 MED ORDER — SODIUM CHLORIDE 0.9% FLUSH: 3.0000 mL | Freq: Once | INTRAVENOUS | Status: DC

## 2019-02-19 MED ORDER — PHENAZOPYRIDINE HCL 200 MG PO TABS: 200.0000 mg | ORAL_TABLET | Freq: Three times a day (TID) | ORAL | 0 refills | Status: DC

## 2019-02-19 NOTE — ED Notes (Signed)
Patient has been keeping father updated

## 2019-02-19 NOTE — ED Provider Notes (Signed)
[facility] EMERGENCY DEPARTMENT Provider Note   CSN: [id number] Arrival date & time: 02/19/19  0930     History Chief Complaint  Patient presents with  . Abdominal Pain    Edward Garrett. is a 24 y.o. male.  HPI    24 year old male comes in a chief complaint of right-sided abdominal pain.  He reports that he started having the pain at 2 AM.  He was awake at that time.  The pain has been fairly constant since then, but the intensity is waxing and waning.  He is also noted that every time he urinates he has severe pain and discomfort.  When he has pain it is in the right lower quadrant.  There is no pain in the flank region or the groin.  Patient has nausea without vomiting.  He has not had a BM since Saturday but he is passing flatus.  No significant medical or surgical history.  No history of UTI, renal stones.   History reviewed. No pertinent past medical history.  Patient Active Problem List   Diagnosis Date Noted  . Nausea 07/03/2017  . PCP NOTES >>>>>>>>>>>>>>>>>>>>> 03/09/2016  . Sports physical 09/11/2010    Past Surgical History:  Procedure Laterality Date  . WISDOM TOOTH EXTRACTION         Family History  Problem Relation Age of Onset  . Coronary artery disease Other        multiple fam members  . CAD Father 75  . Sudden Cardiac Death Other        ??  . Diabetes Neg Hx     Social History   Tobacco Use  . Smoking status: Current Every Day Smoker    Types: E-cigarettes  . Smokeless tobacco: Current User    Types: Chew  Substance Use Topics  . Alcohol use: No  . Drug use: No    Home Medications Prior to Admission medications   Medication Sig Start Date End Date Taking? Authorizing Provider  azelastine (ASTELIN) 0.1 % nasal spray Place 2 sprays into both nostrils at bedtime as needed for rhinitis. Use in each nostril as directed 03/09/16   Wanda Plump, MD  cyclobenzaprine (FLEXERIL) 10 MG tablet Take 1 tablet (10 mg total) by  mouth 3 (three) times daily as needed for muscle spasms. 09/01/17   Sharlene Dory, DO  naproxen (NAPROSYN) 500 MG tablet Take 1 tablet (500 mg total) by mouth 2 (two) times daily with a meal. 09/01/17   Wendling, Jilda Roche, DO  ranitidine (ZANTAC) 150 MG tablet Take 1 tablet (150 mg total) by mouth at bedtime. 06/30/17   Bradd Canary, MD    Allergies    Patient has no known allergies.  Review of Systems   Review of Systems  Constitutional: Positive for activity change.  Gastrointestinal: Positive for abdominal pain.  Genitourinary: Positive for dysuria. Negative for difficulty urinating, discharge, flank pain, frequency, hematuria, penile pain and testicular pain.  Allergic/Immunologic: Negative for immunocompromised state.  All other systems reviewed and are negative.   Physical Exam Updated Vital Signs BP 140/83 (BP Location: Right Arm)   Pulse 78   Temp 98.2 F (36.8 C) (Oral)   Resp 18   Ht 6' (1.829 m)   Wt 113.4 kg   SpO2 100%   BMI 33.91 kg/m   Physical Exam Vitals and nursing note reviewed.  Constitutional:      Appearance: He is well-developed.  HENT:     Head:  Atraumatic.  Cardiovascular:     Rate and Rhythm: Normal rate.  Pulmonary:     Effort: Pulmonary effort is normal.  Abdominal:     Tenderness: There is abdominal tenderness in the right lower quadrant. There is no guarding or rebound. Negative signs include Murphy's sign and McBurney's sign.  Musculoskeletal:     Cervical back: Neck supple.  Skin:    General: Skin is warm.  Neurological:     Mental Status: He is alert and oriented to person, place, and time.     ED Results / Procedures / Treatments   Labs (all labs ordered are listed, but only abnormal results are displayed) Labs Reviewed  COMPREHENSIVE METABOLIC PANEL - Abnormal; Notable for the following components:      Result Value   Glucose, Bld 104 (*)    All other components within normal limits  CBC - Abnormal; Notable for  the following components:   Hemoglobin 17.3 (*)    All other components within normal limits  LIPASE, BLOOD  URINALYSIS, ROUTINE W REFLEX MICROSCOPIC    EKG None  Radiology No results found.  Procedures Procedures (including critical care time)  Medications Ordered in ED Medications  sodium chloride flush (NS) 0.9 % injection 3 mL (has no administration in time range)    ED Course  I have reviewed the triage vital signs and the nursing notes.  Pertinent labs & imaging results that were available during my care of the patient were reviewed by me and considered in my medical decision making (see chart for details).    MDM Rules/Calculators/A&P                      24 year old comes in a chief complaint of abdominal pain. He reports onset of pain at 2 AM with burning with urination.  The pain is nonradiating, and there is no history of kidney stones or bladder infections.  Patient denies any high risk behavior for STDs.  Labs are reassuring.  Negative McBurney's, Murphy's.  No history of surgery and does not sound like this is a small obstruction.  UA ordered. If the UA is negative then we will proceed with reassessment and decide on adding advanced imaging.  Final Clinical Impression(s) / ED Diagnoses Final diagnoses:  None    Rx / DC Orders ED Discharge Orders    None       Varney Biles, MD 02/19/19 1604

## 2019-02-19 NOTE — ED Triage Notes (Signed)
Pt reports RLQ pain since last night. Denies recent fevers, vomiting, c/o nausea. Denies diarrhea, sick contacts. Pt awake, alert, appropriate, NAD at present.

## 2019-02-19 NOTE — Discharge Instructions (Signed)
We saw in the ER for abdominal pain.  As discussed, we saw evidence of hydronephrosis in your kidney.  This condition is often seen when there is kidney stone and we suspect that he might have passed a kidney stone earlier today.  There is no evidence of appendicitis. If you start having severe pain in your right lower abdomen with vomiting, fevers, bloody stools return to the ER immediately. If you continue to have nagging discomfort follow-up with your primary care doctor in 5 to 7 days.

## 2019-02-19 NOTE — ED Notes (Signed)
Tee Stanfill Sr (Father#[phone]) called/waiting outside of ED for an update on his son status.  Thank you

## 2019-02-19 NOTE — ED Notes (Signed)
Pt transported to CT ?

## 2019-02-19 NOTE — ED Notes (Signed)
Per patient call dad when he gets a room

## 2019-02-19 NOTE — ED Notes (Signed)
Urine culture sent down with urine. 

## 2019-02-19 NOTE — ED Notes (Signed)
Patient verbalizes understanding of discharge instructions. Opportunity for questioning and answers were provided. Armband removed by staff, pt discharged from ED.  

## 2019-02-28 ENCOUNTER — Ambulatory Visit: Payer: Managed Care, Other (non HMO) | Admitting: Internal Medicine

## 2019-02-28 ENCOUNTER — Other Ambulatory Visit: Payer: Self-pay

## 2019-02-28 VITALS — BP 139/71 | HR 73 | Temp 97.7°F | Resp 12 | Ht 73.0 in | Wt 269.2 lb

## 2019-02-28 DIAGNOSIS — R21 Rash and other nonspecific skin eruption: Secondary | ICD-10-CM

## 2019-02-28 DIAGNOSIS — N218 Other lower urinary tract calculus: Secondary | ICD-10-CM

## 2019-02-28 MED ORDER — PREDNISONE 10 MG PO TABS: ORAL_TABLET | ORAL | 0 refills | Status: DC

## 2019-02-28 NOTE — Progress Notes (Signed)
   Subjective:    Patient ID: Edward Em., male    DOB: [birth year], 24 y.o.   MRN: [id number]  DOS:  02/28/2019 Type of visit - description: acute  Went to the ER 02/19/2019. he had right-sided abdominal pain, severe dysuria, difficulty urinating, blood work came back satisfactory, CT of the abdomen showed mild right-sided hydronephrosis without evidence of obstruction. Otherwise CT negative. He was told he had kidney stones, was prescribed naproxen and Pyridium. Symptoms quickly resolved and he is now asymptomatic.  His main concern today is very pruritic rash that developed 2 days after he went to the emergency room. Is located at the abdomen and back. More pruritic at night. Denies tongue or lip swelling.  No cough or wheezing.    Review of Systems See above   No past medical history on file.  Past Surgical History:  Procedure Laterality Date  . WISDOM TOOTH EXTRACTION          Objective:   Physical Exam BP 139/71 (BP Location: Right Arm, Cuff Size: Large)   Pulse 73   Temp 97.7 F (36.5 C) (Temporal)   Resp 12   Ht 6\' 1"  (1.854 m)   Wt 269 lb 3.2 oz (122.1 kg)   SpO2 99%   BMI 35.52 kg/m  General:   Well developed, NAD, BMI noted.  HEENT:  Normocephalic . Face symmetric, atraumatic Lungs:  CTA B Normal respiratory effort, no intercostal retractions, no accessory muscle use. Heart: RRR,  no murmur.  no pretibial edema bilaterally  Abdomen:  Not distended, soft, non-tender. No rebound or rigidity.   Skin: Rash located at the abdomen, chest and back.  See picture Neurologic:  alert & oriented X3.  Speech normal, gait appropriate for age and unassisted Psych--  Cognition and judgment appear intact.  Cooperative with normal attention span and concentration.  Behavior appropriate. No anxious or depressed appearing.       Assessment      Assessment Palpitations: Saw cardiology 02-2015, pt declined workup, prefer observation  Urolithiasis DX 1-20  21  PLAN: Urolithiasis: As described above, resolved.  No further eval is needed, recommend good hydration Rash: Symptoms started a couple of days after he got IV contrast and in the context of taking naproxen and Pyridium. He has taken NSAIDs before without any problems. Suspect that the culprit is Pyridium and less likely IV contrast. We will place both on the allergy list noting that in the future if he needs IV contrast we could proceed with perhaps premedication but I definitely recommend to avoid Pyridium. Otherwise recommend prednisone for few days, Benadryl at night and call if not better.  This visit occurred during the SARS-CoV-2 public health emergency.  Safety protocols were in place, including screening questions prior to the visit, additional usage of staff PPE, and extensive cleaning of exam room while observing appropriate contact time as indicated for disinfecting solutions.

## 2019-02-28 NOTE — Patient Instructions (Signed)
For the rash  Take prednisone for few days as prescribed  Benadryl at night  Call if not gradually better in the next 2 to 3 days    You had a kidney stone, keep yourself hydrated.  Call if symptoms come back.

## 2019-03-01 NOTE — Assessment & Plan Note (Signed)
Urolithiasis: As described above, resolved.  No further eval is needed, recommend good hydration Rash: Symptoms started a couple of days after he got IV contrast and in the context of taking naproxen and Pyridium. He has taken NSAIDs before without any problems. Suspect that the culprit is Pyridium and less likely IV contrast. We will place both on the allergy list noting that in the future if he needs IV contrast we could proceed with perhaps premedication but I definitely recommend to avoid Pyridium. Otherwise recommend prednisone for few days, Benadryl at night and call if not better.

## 2019-08-14 ENCOUNTER — Encounter: Payer: Self-pay | Admitting: Internal Medicine

## 2019-11-22 ENCOUNTER — Encounter: Payer: Self-pay | Admitting: Internal Medicine

## 2019-11-22 ENCOUNTER — Ambulatory Visit (INDEPENDENT_AMBULATORY_CARE_PROVIDER_SITE_OTHER): Payer: Self-pay | Admitting: Internal Medicine

## 2019-11-22 ENCOUNTER — Other Ambulatory Visit: Payer: Self-pay

## 2019-11-22 VITALS — BP 129/83 | HR 64 | Temp 98.3°F | Resp 16 | Ht 73.0 in | Wt 272.0 lb

## 2019-11-22 DIAGNOSIS — L247 Irritant contact dermatitis due to plants, except food: Secondary | ICD-10-CM

## 2019-11-22 DIAGNOSIS — L03119 Cellulitis of unspecified part of limb: Secondary | ICD-10-CM

## 2019-11-22 MED ORDER — CEPHALEXIN 500 MG PO CAPS: 500.0000 mg | ORAL_CAPSULE | Freq: Four times a day (QID) | ORAL | 0 refills | Status: DC

## 2019-11-22 MED ORDER — PREDNISONE 10 MG PO TABS: ORAL_TABLET | ORAL | 0 refills | Status: DC

## 2019-11-22 MED ORDER — BETAMETHASONE DIPROPIONATE AUG 0.05 % EX CREA: TOPICAL_CREAM | Freq: Two times a day (BID) | CUTANEOUS | 0 refills | Status: DC

## 2019-11-22 NOTE — Patient Instructions (Addendum)
Please schedule a physical exam at your earliest convenience.   Take prednisone (taper dose) as prescribed  Take cephalexin, an antibiotic for 5 days  Apply the cream twice a day  Wash all your clothing and  boots  Call if not gradually better

## 2019-11-22 NOTE — Progress Notes (Signed)
Pre visit review using our clinic review tool, if applicable. No additional management support is needed unless otherwise documented below in the visit note. 

## 2019-11-22 NOTE — Progress Notes (Signed)
° °  Subjective:    Patient ID: Edward Em., male    DOB: 17-Mar-1995, 24 y.o.   MRN: [id number]  DOS:  11/22/2019 Type of visit - description: Acute Developed a rash at the lower extremities 10 days ago, he was exposed to poison ivy and suspect that is what it is. He is using calamine but that does not seem to be helping. The rash is pruritic.  Denies any fever chills Has gotten larger over time but not in the last 2 days    Review of Systems See above   No past medical history on file.  Past Surgical History:  Procedure Laterality Date   WISDOM TOOTH EXTRACTION      Allergies as of 11/22/2019      Reactions   Pyridium [phenazopyridine]    Contrast Media [iodinated Diagnostic Agents]    ? Of reaction, see OV 02-28-2019       Medication List       Accurate as of November 22, 2019 11:17 AM. If you have any questions, ask your nurse or doctor.        diphenhydrAMINE 25 MG tablet Commonly known as: BENADRYL Take 25 mg by mouth every 6 (six) hours as needed.   predniSONE 10 MG tablet Commonly known as: DELTASONE 3 tabs x 3 days, 2 tabs x 3 days, 1 tab x 3 days          Objective:   Physical Exam BP 129/83 (BP Location: Left Arm, Patient Position: Sitting, Cuff Size: Normal)    Pulse 64    Temp 98.3 F (36.8 C) (Oral)    Resp 16    Ht 6\' 1"  (1.854 m)    Wt 272 lb (123.4 kg)    SpO2 100%    BMI 35.89 kg/m  General:   Well developed, NAD, BMI noted. HEENT:  Normocephalic . Face symmetric, atraumatic Lower extremities: no pretibial edema bilaterally.  Calves symmetric Skin: Has a rash at the left pretibial area, right calf, see pictures.  Areas are modestly warm, not tender, no abscess or pockets. Reports she has a similar rash around the right thigh. Neurologic:  alert & oriented X3.  Speech normal, gait appropriate for age and unassisted Psych--  Cognition and judgment appear intact.  Cooperative with normal attention span and concentration.  Behavior  appropriate. No anxious or depressed appearing.          Assessment     Assessment Palpitations: Saw cardiology 02-2015, pt declined workup, prefer observation  Urolithiasis DX 1-20 21  PLAN: Contact dermatitis, possibly early cellulitis Recommend avoidance of wash over his clothing including his boots Prednisone, Keflex and topical steroid. See AVS.     This visit occurred during the SARS-CoV-2 public health emergency.  Safety protocols were in place, including screening questions prior to the visit, additional usage of staff PPE, and extensive cleaning of exam room while observing appropriate contact time as indicated for disinfecting solutions.

## 2019-11-23 NOTE — Assessment & Plan Note (Signed)
Contact dermatitis, possibly early cellulitis Recommend avoidance of wash over his clothing including his boots Prednisone, Keflex and topical steroid. See AVS.

## 2020-01-23 ENCOUNTER — Telehealth (INDEPENDENT_AMBULATORY_CARE_PROVIDER_SITE_OTHER): Payer: BC Managed Care – PPO | Admitting: Internal Medicine

## 2020-01-23 ENCOUNTER — Encounter: Payer: Self-pay | Admitting: Internal Medicine

## 2020-01-23 VITALS — Temp 98.5°F | Ht 73.0 in | Wt 270.0 lb

## 2020-01-23 DIAGNOSIS — B349 Viral infection, unspecified: Secondary | ICD-10-CM | POA: Diagnosis not present

## 2020-01-23 NOTE — Progress Notes (Signed)
   Subjective:    Patient ID: Edward Em., male    DOB: September 21, 1995, 24 y.o.   MRN: [id number]  DOS:  01/23/2020 Type of visit - description: Virtual Visit via Video Note  I connected with the above patient  by a video enabled telemedicine application and verified that I am speaking with the correct person using two identifiers.   THIS ENCOUNTER IS A VIRTUAL VISIT DUE TO COVID-19 - PATIENT WAS NOT SEEN IN THE OFFICE. PATIENT HAS CONSENTED TO VIRTUAL VISIT / TELEMEDICINE VISIT   Location of patient: home  Location of provider: office  Persons participating in the virtual visit: patient, provider   I discussed the limitations of evaluation and management by telemedicine and the availability of in person appointments. The patient expressed understanding and agreed to proceed.  Acute 2 days ago developed chest congestion, some cough, nose congestion and malaise. Today for the first time he feels better but the symptoms are not completely gone.  Had subjective fever one time. No nausea or vomiting Had a mild headache No chest pain, difficulty breathing.  No rash   Review of Systems See above   No past medical history on file.  Past Surgical History:  Procedure Laterality Date  . WISDOM TOOTH EXTRACTION      Allergies as of 01/23/2020      Reactions   Pyridium [phenazopyridine]    Contrast Media [iodinated Diagnostic Agents]    ? Of reaction, see OV 02-28-2019       Medication List       Accurate as of January 23, 2020 11:59 PM. If you have any questions, ask your nurse or doctor.        STOP taking these medications   augmented betamethasone dipropionate 0.05 % cream Commonly known as: DIPROLENE-AF Stopped by: Willow Ora, MD   cephALEXin 500 MG capsule Commonly known as: KEFLEX Stopped by: Willow Ora, MD   diphenhydrAMINE 25 MG tablet Commonly known as: BENADRYL Stopped by: Willow Ora, MD   predniSONE 10 MG tablet Commonly known as: DELTASONE Stopped by:  Willow Ora, MD          Objective:   Physical Exam Temp 98.5 F (36.9 C) (Temporal)   Ht 6\' 1"  (1.854 m)   Wt 270 lb (122.5 kg)   BMI 35.62 kg/m     This is a virtual video visit, he looks well, alert oriented x3.  No vital signs, no O2 sats   Assessment      Assessment Palpitations: Saw cardiology 02-2015, pt declined workup, prefer observation  Urolithiasis DX 1-20 21  PLAN: Viral illness: Patient is 24 years old, not Covid vaccinated, developed symptoms 2 days ago, no red flags, today he feels better. Strongly recommend to be tested for Covid, if possible PCR;  until then recommend to use a mask mostly to protect his family. I also recommend rest, fluids, OTCs for cough.  Call if worse. He is aware that although he is young and healthy he could get very sick with COVID thus once he is better recommend to give reconsider covid vaccination   I discussed the assessment and treatment plan with the patient. The patient was provided an opportunity to ask questions and all were answered. The patient agreed with the plan and demonstrated an understanding of the instructions.   The patient was advised to call back or seek an in-person evaluation if the symptoms worsen or if the condition fails to improve as anticipated.

## 2020-01-24 NOTE — Assessment & Plan Note (Signed)
Viral illness: Patient is 24 years old, not Covid vaccinated, developed symptoms 2 days ago, no red flags, today he feels better. Strongly recommend to be tested for Covid, if possible PCR;  until then recommend to use a mask mostly to protect his family. I also recommend rest, fluids, OTCs for cough.  Call if worse. He is aware that although he is young and healthy he could get very sick with COVID thus once he is better recommend to give reconsider covid vaccination

## 2020-02-11 ENCOUNTER — Other Ambulatory Visit: Payer: BC Managed Care – PPO

## 2020-02-11 DIAGNOSIS — Z20822 Contact with and (suspected) exposure to covid-19: Secondary | ICD-10-CM

## 2020-02-12 DIAGNOSIS — Z20822 Contact with and (suspected) exposure to covid-19: Secondary | ICD-10-CM | POA: Diagnosis not present

## 2020-02-14 LAB — NOVEL CORONAVIRUS, NAA: SARS-CoV-2, NAA: DETECTED — AB

## 2020-02-14 LAB — SARS-COV-2, NAA 2 DAY TAT

## 2020-02-15 ENCOUNTER — Telehealth: Payer: Self-pay | Admitting: Adult Health

## 2020-02-15 NOTE — Telephone Encounter (Signed)
Called to discuss with patient about COVID-19 symptoms and the use of one of the available treatments for those with mild to moderate Covid symptoms and at a high risk of hospitalization.  Pt appears to qualify for outpatient treatment due to co-morbid conditions and/or a member of an at-risk group in accordance with the FDA Emergency Use Authorization.    Discussion with patient revealed his is no longer symptomatic and does not qualify.  I have removed him from our list.    Noreene Filbert

## 2020-02-20 ENCOUNTER — Other Ambulatory Visit: Payer: Self-pay

## 2020-02-20 ENCOUNTER — Telehealth (INDEPENDENT_AMBULATORY_CARE_PROVIDER_SITE_OTHER): Payer: BC Managed Care – PPO | Admitting: Internal Medicine

## 2020-02-20 ENCOUNTER — Ambulatory Visit (HOSPITAL_BASED_OUTPATIENT_CLINIC_OR_DEPARTMENT_OTHER)
Admission: RE | Admit: 2020-02-20 | Discharge: 2020-02-20 | Disposition: A | Discharge status: Home / Self Care | Payer: BC Managed Care – PPO | Source: Ambulatory Visit | Attending: Internal Medicine | Admitting: Internal Medicine

## 2020-02-20 ENCOUNTER — Encounter: Payer: Self-pay | Admitting: Internal Medicine

## 2020-02-20 VITALS — Temp 98.6°F | Ht 73.0 in | Wt 270.0 lb

## 2020-02-20 DIAGNOSIS — R7611 Nonspecific reaction to tuberculin skin test without active tuberculosis: Secondary | ICD-10-CM | POA: Diagnosis not present

## 2020-02-20 DIAGNOSIS — U071 COVID-19: Secondary | ICD-10-CM | POA: Insufficient documentation

## 2020-02-20 MED ORDER — AZITHROMYCIN 250 MG PO TABS: ORAL_TABLET | ORAL | 0 refills | Status: DC

## 2020-02-20 NOTE — Progress Notes (Signed)
   Subjective:    Patient ID: Edward Em., male    DOB: 10/10/1995, 25 y.o.   MRN: [id number]  DOS:  02/20/2020 Type of visit - description: Virtual Visit via Video Note  I connected with the above patient  by a video enabled telemedicine application and verified that I am speaking with the correct person using two identifiers.   THIS ENCOUNTER IS A VIRTUAL VISIT DUE TO COVID-19 - PATIENT WAS NOT SEEN IN THE OFFICE. PATIENT HAS CONSENTED TO VIRTUAL VISIT / TELEMEDICINE VISIT   Location of patient: home  Location of provider: office  Persons participating in the virtual visit: patient, provider   I discussed the limitations of evaluation and management by telemedicine and the availability of in person appointments. The patient expressed understanding and agreed to proceed.  Acute He was doing well until 02/10/2020: Developed fever, subsequently checked for COVID 02/10/20 and it came back positive. Fever lasted only 1 day. Few days later developed back pain, chest congestion, poor appetite and some cough.  At this point, his main symptom is mild chest congestion and cough.  He is concerned about having pneumonia.  Denies headache or rash No difficulty breathing at rest but some difficulty breathing with exertion. No nausea or vomiting but appetite is decreased, "nothing tastes good".   Review of Systems See above   No past medical history on file.  Past Surgical History:  Procedure Laterality Date  . WISDOM TOOTH EXTRACTION      Allergies as of 02/20/2020      Reactions   Pyridium [phenazopyridine]    Contrast Media [iodinated Diagnostic Agents]    ? Of reaction, see OV 02-28-2019       Medication List    as of February 20, 2020  3:08 PM   You have not been prescribed any medications.        Objective:   Physical Exam Temp 98.6 F (37 C) (Axillary)   Ht 6\' 1"  (1.854 m)   Wt 270 lb (122.5 kg)   BMI 35.62 kg/m  This is a virtual video visit, alert oriented  x3, in no apparent distress.  No cough noted.    Assessment     Assessment Palpitations: Saw cardiology 02-2015, pt declined workup, prefer observation  Urolithiasis DX 1-20 21  PLAN: COVID-19: The patient was seen with a viral illness in December, subsequently he recuperated without any problems. 10 days ago developed a fever and subsequently did test positive for COVID-19. Fever lasted 1 day and current symptoms are cough and chest congestion, he is concerned about pneumonia. Plan: Chest x-ray Treat for possible superimposed bronchitis with a Z-Pak, Robitussin, Tylenol, rest and fluids. Call if not gradually better, call if severe symptoms including shortness of breath or severe chest pain. Encouraged to get a pulse oximeter, notify me if O2 sat less than 94%. I again asked him to reconsider getting COVID vaccination 30 days from initial symptoms.  He seem skeptical  I discussed the assessment and treatment plan with the patient. The patient was provided an opportunity to ask questions and all were answered. The patient agreed with the plan and demonstrated an understanding of the instructions.   The patient was advised to call back or seek an in-person evaluation if the symptoms worsen or if the condition fails to improve as anticipated.

## 2020-02-21 NOTE — Assessment & Plan Note (Signed)
COVID-19: The patient was seen with a viral illness in December, subsequently he recuperated without any problems. 10 days ago developed a fever and subsequently did test positive for COVID-19. Fever lasted 1 day and current symptoms are cough and chest congestion, he is concerned about pneumonia. Plan: Chest x-ray Treat for possible superimposed bronchitis with a Z-Pak, Robitussin, Tylenol, rest and fluids. Call if not gradually better, call if severe symptoms including shortness of breath or severe chest pain. Encouraged to get a pulse oximeter, notify me if O2 sat less than 94%. I again asked him to reconsider getting COVID vaccination 30 days from initial symptoms.  He seem skeptical

## 2020-04-09 ENCOUNTER — Encounter: Payer: Self-pay | Admitting: Internal Medicine

## 2020-10-30 ENCOUNTER — Other Ambulatory Visit: Payer: Self-pay

## 2020-10-30 ENCOUNTER — Encounter: Payer: Self-pay | Admitting: Family

## 2020-10-30 ENCOUNTER — Ambulatory Visit: Payer: BC Managed Care – PPO | Admitting: Family

## 2020-10-30 VITALS — BP 118/80 | HR 83 | Temp 98.1°F | Resp 18 | Ht 72.0 in | Wt 262.2 lb

## 2020-10-30 DIAGNOSIS — R109 Unspecified abdominal pain: Secondary | ICD-10-CM | POA: Diagnosis not present

## 2020-10-30 NOTE — Progress Notes (Signed)
  Edward Garrett. is a 25 y.o. male with the following history as recorded in [employer]:  Patient Active Problem List   Diagnosis Date Noted   Nausea 07/03/2017   PCP NOTES >>>>>>>>>>>>>>>>>>>>> 03/09/2016   Sports physical 09/11/2010    No current outpatient medications on file.   No current facility-administered medications for this visit.    Allergies: Pyridium [phenazopyridine] and Contrast media [iodinated diagnostic agents]  No past medical history on file.  Past Surgical History:  Procedure Laterality Date   WISDOM TOOTH EXTRACTION      Family History  Problem Relation Age of Onset   Coronary artery disease Other        multiple fam members   CAD Father 67   Sudden Cardiac Death Other        ??   Diabetes Neg Hx     Social History   Tobacco Use   Smoking status: Every Day    Types: E-cigarettes   Smokeless tobacco: Former    Types: Chew  Substance Use Topics   Alcohol use: No    Subjective:   Abdominal pain x 2-3 days- "feels very heavy." Admits has not had a normal bowel movement in a few days- able to have some small hard stools; admits eats a lot of fast food; does drink water; no vomiting or fever; using Miralax with some relief; no changes in urination;     Objective:  Vitals:   10/30/20 1425  BP: 118/80  Pulse: 83  Resp: 18  Temp: 98.1 F (36.7 C)  TempSrc: Oral  SpO2: 98%  Weight: 262 lb 3.2 oz (118.9 kg)  Height: 6' (1.829 m)    General: Well developed, well nourished, in no acute distress  Skin : Warm and dry.  Head: Normocephalic and atraumatic  Lungs: Respirations unlabored; clear to auscultation bilaterally without wheeze, rales, rhonchi  CVS exam: normal rate and regular rhythm.  Abdomen: Soft; nontender; nondistended; normoactive bowel sounds; no masses or hepatosplenomegaly  Neurologic: Alert and oriented; speech intact; face symmetrical; moves all extremities well; CNII-XII intact without focal deficit   Assessment:  1.  Abdominal pain, unspecified abdominal location     Plan:  Physical exam is reassuring and patient's appearance is reassuring; suspect constipation- he will try OTC Milk of Magnesia or glycerin suppository for immediate relief; check CBC, CMP today; to consider imaging if symptoms persist.   This visit occurred during the SARS-CoV-2 public health emergency.  Safety protocols were in place, including screening questions prior to the visit, additional usage of staff PPE, and extensive cleaning of exam room while observing appropriate contact time as indicated for disinfecting solutions.    No follow-ups on file.  Orders Placed This Encounter  Procedures   CBC with Differential/Platelet   Comp Met (CMET)    Requested Prescriptions    No prescriptions requested or ordered in this encounter

## 2020-10-31 LAB — COMPREHENSIVE METABOLIC PANEL
ALT: 15 U/L (ref 0–53)
AST: 17 U/L (ref 0–37)
Albumin: 4.6 g/dL (ref 3.5–5.2)
Alkaline Phosphatase: 81 U/L (ref 39–117)
BUN: 13 mg/dL (ref 6–23)
CO2: 30 mEq/L (ref 19–32)
Calcium: 9.8 mg/dL (ref 8.4–10.5)
Chloride: 98 mEq/L (ref 96–112)
Creatinine, Ser: 1.06 mg/dL (ref 0.40–1.50)
GFR: 97.96 mL/min (ref >60.00)
Glucose, Bld: 85 mg/dL (ref 70–99)
Potassium: 4.6 mEq/L (ref 3.5–5.1)
Sodium: 139 mEq/L (ref 135–145)
Total Bilirubin: 0.8 mg/dL (ref 0.2–1.2)
Total Protein: 7.6 g/dL (ref 6.0–8.3)

## 2020-10-31 LAB — CBC WITH DIFFERENTIAL/PLATELET
Basophils Absolute: 0.1 10*3/uL (ref 0.0–0.1)
Basophils Relative: 1.1 % (ref 0.0–3.0)
Eosinophils Absolute: 0.2 10*3/uL (ref 0.0–0.7)
Eosinophils Relative: 3.2 % (ref 0.0–5.0)
HCT: 47.7 % (ref 39.0–52.0)
Hemoglobin: 16.2 g/dL (ref 13.0–17.0)
Lymphocytes Relative: 26.4 % (ref 12.0–46.0)
Lymphs Abs: 1.6 10*3/uL (ref 0.7–4.0)
MCHC: 34.1 g/dL (ref 30.0–36.0)
MCV: 87.8 fl (ref 78.0–100.0)
Monocytes Absolute: 0.4 10*3/uL (ref 0.1–1.0)
Monocytes Relative: 7.2 % (ref 3.0–12.0)
Neutro Abs: 3.7 10*3/uL (ref 1.4–7.7)
Neutrophils Relative %: 62.1 % (ref 43.0–77.0)
Platelets: 233 10*3/uL (ref 150.0–400.0)
RBC: 5.43 Mil/uL (ref 4.22–5.81)
RDW: 12.1 % (ref 11.5–15.5)
WBC: 6 10*3/uL (ref 4.0–10.5)

## 2020-11-03 ENCOUNTER — Telehealth: Payer: Self-pay | Admitting: *Deleted

## 2020-11-03 NOTE — Telephone Encounter (Signed)
Patient notified and he is fine enough to wait for appt tomorrow.  Advised if these symptoms do occur to go to ER right away.

## 2020-11-03 NOTE — Telephone Encounter (Signed)
Advise patient: If he has severe pain and he still constipated needs to be seen today (ER) particularly if he has fever, chills or nausea

## 2020-11-03 NOTE — Telephone Encounter (Signed)
FYI  Patient mother called to ask about results and if they were ok.  Advised that labs were reviewed yesterday and I was unable to give results to here because of hippa.  She stated that patient is still not able to use the bathroom and still having abdominal pain. She stated that she made an appointment for him to come in tomorrow (with Vernona Rieger).

## 2020-11-04 ENCOUNTER — Ambulatory Visit: Payer: BC Managed Care – PPO | Admitting: Family

## 2020-11-04 ENCOUNTER — Other Ambulatory Visit: Payer: Self-pay

## 2020-11-04 ENCOUNTER — Ambulatory Visit (INDEPENDENT_AMBULATORY_CARE_PROVIDER_SITE_OTHER)
Admission: RE | Admit: 2020-11-04 | Discharge: 2020-11-04 | Disposition: A | Discharge status: Home / Self Care | Payer: BC Managed Care – PPO | Source: Ambulatory Visit | Attending: Family | Admitting: Family

## 2020-11-04 ENCOUNTER — Encounter: Payer: Self-pay | Admitting: Family

## 2020-11-04 VITALS — BP 110/78 | HR 79 | Temp 98.3°F | Ht 72.0 in | Wt 260.0 lb

## 2020-11-04 DIAGNOSIS — R109 Unspecified abdominal pain: Secondary | ICD-10-CM

## 2020-11-04 DIAGNOSIS — R1084 Generalized abdominal pain: Secondary | ICD-10-CM | POA: Diagnosis not present

## 2020-11-04 DIAGNOSIS — K5900 Constipation, unspecified: Secondary | ICD-10-CM | POA: Diagnosis not present

## 2020-11-04 NOTE — Progress Notes (Signed)
Edward Garrett. is a 25 y.o. male with the following history as recorded in [employer]:  Patient Active Problem List   Diagnosis Date Noted   Nausea 07/03/2017   PCP NOTES >>>>>>>>>>>>>>>>>>>>> 03/09/2016   Sports physical 09/11/2010    No current outpatient medications on file.   No current facility-administered medications for this visit.    Allergies: Pyridium [phenazopyridine] and Contrast media [iodinated diagnostic agents]  No past medical history on file.  Past Surgical History:  Procedure Laterality Date   WISDOM TOOTH EXTRACTION      Family History  Problem Relation Age of Onset   Coronary artery disease Other        multiple fam members   CAD Father 41   Sudden Cardiac Death Other        ??   Diabetes Neg Hx     Social History   Tobacco Use   Smoking status: Every Day    Types: E-cigarettes   Smokeless tobacco: Former    Types: Chew  Substance Use Topics   Alcohol use: No    Subjective:  Patient was seen last week with suspected constipation. He notes he is actually feeling better- has had more normal bowel movements in the past few days/ actually had diarrhea this morning after taking Milk of Magnesia last night; is concerned that this pattern is abnormal for him however and just wants to be sure no blockage; no vomiting or abdominal pain; was able to eat normally yesterday;     Objective:  Vitals:   11/04/20 0952  BP: 110/78  Pulse: 79  Temp: 98.3 F (36.8 C)  TempSrc: Oral  SpO2: 99%  Weight: 260 lb (117.9 kg)  Height: 6' (1.829 m)    General: Well developed, well nourished, in no acute distress  Skin : Warm and dry.  Head: Normocephalic and atraumatic  Eyes: Sclera and conjunctiva clear; pupils round and reactive to light; extraocular movements intact  Ears: External normal; canals clear; tympanic membranes normal  Oropharynx: Pink, supple. No suspicious lesions  Neck: Supple without thyromegaly, adenopathy  Lungs: Respirations unlabored;  clear to auscultation bilaterally without wheeze, rales, rhonchi  CVS exam: normal rate and regular rhythm.  Abdomen: Soft; nontender; nondistended; normoactive bowel sounds; no masses or hepatosplenomegaly  Neurologic: Alert and oriented; speech intact; face symmetrical; moves all extremities well; CNII-XII intact without focal deficit  Assessment:  1. Constipation, unspecified constipation type   2. Abdominal pain, unspecified abdominal location     Plan:  Labs done last week were reassuring; physical exam is reassuring and patient is feeling better; will update X-ray to rule out any type of obstruction- do not think CT warranted at this time; encouraged to drink more water, increase fiber;  Follow up worse, no better.   This visit occurred during the SARS-CoV-2 public health emergency.  Safety protocols were in place, including screening questions prior to the visit, additional usage of staff PPE, and extensive cleaning of exam room while observing appropriate contact time as indicated for disinfecting solutions.    No follow-ups on file.  Orders Placed This Encounter  Procedures   DG Abd 2 Views    Standing Status:   Future    Number of Occurrences:   1    Standing Expiration Date:   11/04/2021    Order Specific Question:   Reason for Exam (SYMPTOM  OR DIAGNOSIS REQUIRED)    Answer:   abdominal pain    Order Specific Question:   Preferred  imaging location?    Answer:   [facility]    Requested Prescriptions    No prescriptions requested or ordered in this encounter

## 2020-11-04 NOTE — Patient Instructions (Signed)
[address] [place] Go to basement/ X-ray on right- just walk in and I will let you know once I get results

## 2021-03-24 ENCOUNTER — Encounter: Payer: Self-pay | Admitting: Internal Medicine

## 2021-04-09 DIAGNOSIS — N200 Calculus of kidney: Secondary | ICD-10-CM | POA: Diagnosis not present

## 2021-04-09 DIAGNOSIS — R110 Nausea: Secondary | ICD-10-CM | POA: Diagnosis not present

## 2021-04-09 DIAGNOSIS — R109 Unspecified abdominal pain: Secondary | ICD-10-CM | POA: Diagnosis not present

## 2022-08-19 ENCOUNTER — Ambulatory Visit: Payer: Managed Care, Other (non HMO) | Admitting: Family Medicine

## 2022-08-19 ENCOUNTER — Encounter: Payer: Self-pay | Admitting: Internal Medicine

## 2022-08-19 ENCOUNTER — Encounter: Payer: Self-pay | Admitting: Family Medicine

## 2022-08-19 VITALS — BP 104/80 | HR 92 | Temp 98.7°F | Resp 18 | Ht 72.0 in | Wt 257.8 lb

## 2022-08-19 DIAGNOSIS — J0140 Acute pansinusitis, unspecified: Secondary | ICD-10-CM | POA: Insufficient documentation

## 2022-08-19 DIAGNOSIS — R0981 Nasal congestion: Secondary | ICD-10-CM | POA: Diagnosis not present

## 2022-08-19 LAB — POC COVID19 BINAXNOW: SARS Coronavirus 2 Ag: NEGATIVE

## 2022-08-19 MED ORDER — FLUTICASONE PROPIONATE 50 MCG/ACT NA SUSP: 2.0000 | Freq: Every day | NASAL | 6 refills | Status: AC

## 2022-08-19 MED ORDER — DOXYCYCLINE HYCLATE 100 MG PO TABS: 100.0000 mg | ORAL_TABLET | Freq: Two times a day (BID) | ORAL | 0 refills | Status: AC

## 2022-08-19 NOTE — Progress Notes (Signed)
Established Patient Office Visit  Subjective   Patient ID: Edward Garrett., male    DOB: 25-Nov-1995  Age: 27 y.o. MRN: [id number]  Chief Complaint  Patient presents with   Sinus Problem    Xs started last night, no covid test. Temp of 103 last night    HPI Discussed the use of AI scribe software for clinical note transcription with the patient, who gave verbal consent to proceed.  History of Present Illness   The patient presents with a one-day history of fever, chills, and body aches. The highest recorded temperature was 103 degrees Fahrenheit. He also reports some pressure behind the eyes. He denies cough, sore throat, and sinus congestion. He has been in nursing homes recently but is not aware of any sick contacts. He has taken Tylenol for fever management. He has no known allergies to antibiotics and is not on any regular medications.      Patient Active Problem List   Diagnosis Date Noted   Acute non-recurrent pansinusitis 08/19/2022   Nausea 07/03/2017   PCP NOTES >>>>>>>>>>>>>>>>>>>>> 03/09/2016   Sports physical 09/11/2010   History reviewed. No pertinent past medical history. Past Surgical History:  Procedure Laterality Date   WISDOM TOOTH EXTRACTION     Social History   Tobacco Use   Smoking status: Every Day    Types: E-cigarettes   Smokeless tobacco: Former    Types: Chew  Substance Use Topics   Alcohol use: No   Drug use: No   Social History   Socioeconomic History   Marital status: Single    Spouse name: Not on file   Number of children: 0   Years of education: Not on file   Highest education level: Not on file  Occupational History   Occupation: works   Tobacco Use   Smoking status: Every Day    Types: E-cigarettes   Smokeless tobacco: Former    Types: Chew  Substance and Sexual Activity   Alcohol use: No   Drug use: No   Sexual activity: Not Currently    Birth control/protection: None  Other Topics Concern   Not on file  Social  History Narrative   Household: F, M         Social Determinants of Health   Financial Resource Strain: Not on file  Food Insecurity: Not on file  Transportation Needs: Not on file  Physical Activity: Not on file  Stress: Not on file  Social Connections: Not on file  Intimate Partner Violence: Not on file   Family Status  Relation Name Status   Other  (Not Specified)   Father  Alive   Other  (Not Specified)   Mother  Alive   MGM  Alive   MGF  Alive   PGM  Alive   PGF  Alive   Neg Hx  (Not Specified)  No partnership data on file   Family History  Problem Relation Age of Onset   Coronary artery disease Other        multiple fam members   CAD Father 67   Sudden Cardiac Death Other        ??   Diabetes Neg Hx    Allergies  Allergen Reactions   Pyridium [Phenazopyridine]    Contrast Media [Iodinated Contrast Media]     ? Of reaction, see OV 02-28-2019       Review of Systems  Constitutional:  Negative for fever and malaise/fatigue.  HENT:  Positive for congestion  and sinus pain. Negative for sore throat.   Eyes:  Negative for blurred vision.  Respiratory:  Negative for cough and shortness of breath.   Cardiovascular:  Negative for chest pain, palpitations and leg swelling.  Gastrointestinal:  Negative for abdominal pain, blood in stool and nausea.  Genitourinary:  Negative for dysuria and frequency.  Musculoskeletal:  Negative for falls.  Skin:  Negative for rash.  Neurological:  Negative for dizziness, loss of consciousness and headaches.  Endo/Heme/Allergies:  Negative for environmental allergies.  Psychiatric/Behavioral:  Negative for depression. The patient is not nervous/anxious.       Objective:     BP 104/80 (BP Location: Right Arm, Patient Position: Sitting, Cuff Size: Large)   Pulse 92   Temp 98.7 F (37.1 C) (Oral)   Resp 18   Ht 6' (1.829 m)   Wt 257 lb 12.8 oz (116.9 kg)   SpO2 97%   BMI 34.96 kg/m  BP Readings from Last 3 Encounters:   08/19/22 104/80  11/04/20 110/78  10/30/20 118/80   Wt Readings from Last 3 Encounters:  08/19/22 257 lb 12.8 oz (116.9 kg)  11/04/20 260 lb (117.9 kg)  10/30/20 262 lb 3.2 oz (118.9 kg)   SpO2 Readings from Last 3 Encounters:  08/19/22 97%  11/04/20 99%  10/30/20 98%      Physical Exam Vitals and nursing note reviewed.  Constitutional:      General: He is not in acute distress.    Appearance: Normal appearance.  HENT:     Head: Normocephalic and atraumatic.     Right Ear: Tympanic membrane, ear canal and external ear normal. There is no impacted cerumen.     Left Ear: Tympanic membrane, ear canal and external ear normal. There is no impacted cerumen.     Nose: Congestion present. No rhinorrhea.     Right Sinus: Maxillary sinus tenderness and frontal sinus tenderness present.     Left Sinus: Maxillary sinus tenderness and frontal sinus tenderness present.     Mouth/Throat:     Mouth: Mucous membranes are moist.     Pharynx: Oropharynx is clear. No oropharyngeal exudate or posterior oropharyngeal erythema.  Eyes:     General: No scleral icterus.       Right eye: No discharge.        Left eye: No discharge.     Conjunctiva/sclera: Conjunctivae normal.  Cardiovascular:     Rate and Rhythm: Normal rate and regular rhythm.     Heart sounds: Normal heart sounds.  Pulmonary:     Effort: Pulmonary effort is normal. No respiratory distress.     Breath sounds: Normal breath sounds.  Musculoskeletal:     Cervical back: Normal range of motion.  Lymphadenopathy:     Cervical: No cervical adenopathy.  Skin:    General: Skin is warm and dry.  Neurological:     Mental Status: He is alert and oriented to person, place, and time.  Psychiatric:        Mood and Affect: Mood normal.        Behavior: Behavior normal.        Thought Content: Thought content normal.        Judgment: Judgment normal.      No results found for any visits on 08/19/22.  Last CBC Lab Results   Component Value Date   WBC 6.0 10/30/2020   HGB 16.2 10/30/2020   HCT 47.7 10/30/2020   MCV 87.8 10/30/2020   MCH 30.5 02/19/2019  RDW 12.1 10/30/2020   PLT 233.0 10/30/2020   Last metabolic panel Lab Results  Component Value Date   GLUCOSE 85 10/30/2020   NA 139 10/30/2020   K 4.6 10/30/2020   CL 98 10/30/2020   CO2 30 10/30/2020   BUN 13 10/30/2020   CREATININE 1.06 10/30/2020   GFR 97.96 10/30/2020   CALCIUM 9.8 10/30/2020   PROT 7.6 10/30/2020   ALBUMIN 4.6 10/30/2020   BILITOT 0.8 10/30/2020   ALKPHOS 81 10/30/2020   AST 17 10/30/2020   ALT 15 10/30/2020   ANIONGAP 14 02/19/2019   Last lipids No results found for: "CHOL", "HDL", "LDLCALC", "LDLDIRECT", "TRIG", "CHOLHDL" Last hemoglobin A1c No results found for: "HGBA1C" Last thyroid functions Lab Results  Component Value Date   TSH 2.37 12/16/2015   Last vitamin D No results found for: "25OHVITD2", "25OHVITD3", "VD25OH" Last vitamin B12 and Folate No results found for: "VITAMINB12", "FOLATE"    The ASCVD Risk score (Arnett DK, et al., 2019) failed to calculate for the following reasons:   The 2019 ASCVD risk score is only valid for ages 68 to 27    Assessment & Plan:   Problem List Items Addressed This Visit       Unprioritized   Acute non-recurrent pansinusitis - Primary    Doxycycline x 10 days  Flonase and antihistamine Covid negative Return to office as needed       Relevant Medications   doxycycline (VIBRA-TABS) 100 MG tablet   fluticasone (FLONASE) 50 MCG/ACT nasal spray   Other Visit Diagnoses     Nasal congestion       Relevant Medications   doxycycline (VIBRA-TABS) 100 MG tablet   fluticasone (FLONASE) 50 MCG/ACT nasal spray   Other Relevant Orders   POC COVID-19     Assessment and Plan    Suspected Sinusitis: Presented with fever, chills, and mild body aches. No cough or sore throat. Reported pain behind eyes. Fever as high as 103F. COVID test negative. -Prescribe  Doxycycline and Flonase. -Advise to add an antihistamine like Zyrtec or Claritin for any drainage or nasal symptoms. -If not improved in a couple of days, repeat COVID test at home and notify clinic if positive. -Advise to wear a mask around elderly individuals in nursing homes.  Work Excuse: Requested work excuse for current day and following day due to illness. -Provide work excuse for 08/19/2022 and 08/20/2022.        No follow-ups on file.    Donato Schultz, DO

## 2022-08-19 NOTE — Assessment & Plan Note (Signed)
Doxycycline x 10 days  Flonase and antihistamine Covid negative Return to office as needed

## 2023-05-23 ENCOUNTER — Encounter: Payer: Self-pay | Admitting: Internal Medicine
